# Patient Record
Sex: Female | Born: 1995 | Race: Black or African American | Hispanic: No | Marital: Married | Smoking: Current every day smoker
Health system: Southern US, Community
[De-identification: ages and names within clinical notes are randomized; demographics above are authoritative.]

## PROBLEM LIST (undated history)

## (undated) ENCOUNTER — Inpatient Hospital Stay (HOSPITAL_COMMUNITY): Payer: Self-pay

## (undated) DIAGNOSIS — F419 Anxiety disorder, unspecified: Secondary | ICD-10-CM

## (undated) DIAGNOSIS — D573 Sickle-cell trait: Secondary | ICD-10-CM

## (undated) DIAGNOSIS — F410 Panic disorder [episodic paroxysmal anxiety] without agoraphobia: Secondary | ICD-10-CM

## (undated) HISTORY — DX: Sickle-cell trait: D57.3

## (undated) HISTORY — PX: WISDOM TOOTH EXTRACTION: SHX21

## (undated) HISTORY — PX: OTHER SURGICAL HISTORY: SHX169

---
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
## 2013-03-26 ENCOUNTER — Emergency Department (HOSPITAL_COMMUNITY)
Admission: EM | Admit: 2013-03-26 | Discharge: 2013-03-26 | Disposition: A | Discharge status: Home / Self Care | Payer: Medicaid Other | Attending: Emergency Medicine | Admitting: Emergency Medicine

## 2013-03-26 ENCOUNTER — Encounter (HOSPITAL_COMMUNITY): Payer: Self-pay | Admitting: Emergency Medicine

## 2013-03-26 DIAGNOSIS — T7840XA Allergy, unspecified, initial encounter: Secondary | ICD-10-CM

## 2013-03-26 DIAGNOSIS — Z87891 Personal history of nicotine dependence: Secondary | ICD-10-CM | POA: Insufficient documentation

## 2013-03-26 DIAGNOSIS — R21 Rash and other nonspecific skin eruption: Secondary | ICD-10-CM | POA: Insufficient documentation

## 2013-03-26 DIAGNOSIS — R079 Chest pain, unspecified: Secondary | ICD-10-CM | POA: Insufficient documentation

## 2013-03-26 DIAGNOSIS — T4995XA Adverse effect of unspecified topical agent, initial encounter: Secondary | ICD-10-CM | POA: Insufficient documentation

## 2013-03-26 MED ORDER — PREDNISONE 20 MG PO TABS
60.0000 mg | ORAL_TABLET | Freq: Once | ORAL | Status: AC
  Administered 2013-03-26: 60 mg via ORAL
  Filled 2013-03-26: qty 3

## 2013-03-26 MED ORDER — EPINEPHRINE 0.3 MG/0.3ML IJ SOAJ: 0.3000 mg | INTRAMUSCULAR | Status: DC | PRN

## 2013-03-26 MED ORDER — PREDNISONE 20 MG PO TABS: 60.0000 mg | ORAL_TABLET | Freq: Every day | ORAL | Status: DC

## 2013-03-26 MED ORDER — FAMOTIDINE 20 MG PO TABS
20.0000 mg | ORAL_TABLET | Freq: Once | ORAL | Status: AC
  Administered 2013-03-26: 20 mg via ORAL
  Filled 2013-03-26: qty 1

## 2013-03-26 MED ORDER — DIPHENHYDRAMINE HCL 25 MG PO CAPS
25.0000 mg | ORAL_CAPSULE | Freq: Once | ORAL | Status: AC
  Administered 2013-03-26: 25 mg via ORAL
  Filled 2013-03-26: qty 1

## 2013-03-26 NOTE — Discharge Instructions (Signed)
Allergies °Allergies may happen from anything your body is sensitive to. This may be food, medicines, pollens, chemicals, and nearly anything around you in everyday life that produces allergens. An allergen is anything that causes an allergy producing substance. Heredity is often a factor in causing these problems. This means you may have some of the same allergies as your parents. °Food allergies happen in all age groups. Food allergies are some of the most severe and life threatening. Some common food allergies are cow's milk, seafood, eggs, nuts, wheat, and soybeans. °SYMPTOMS  °· Swelling around the mouth. °· An itchy red rash or hives. °· Vomiting or diarrhea. °· Difficulty breathing. °SEVERE ALLERGIC REACTIONS ARE LIFE-THREATENING. °This reaction is called anaphylaxis. It can cause the mouth and throat to swell and cause difficulty with breathing and swallowing. In severe reactions only a trace amount of food (for example, peanut oil in a salad) may cause death within seconds. °Seasonal allergies occur in all age groups. These are seasonal because they usually occur during the same season every year. They may be a reaction to molds, grass pollens, or tree pollens. Other causes of problems are house dust mite allergens, pet dander, and mold spores. The symptoms often consist of nasal congestion, a runny itchy nose associated with sneezing, and tearing itchy eyes. There is often an associated itching of the mouth and ears. The problems happen when you come in contact with pollens and other allergens. Allergens are the particles in the air that the body reacts to with an allergic reaction. This causes you to release allergic antibodies. Through a chain of events, these eventually cause you to release histamine into the blood stream. Although it is meant to be protective to the body, it is this release that causes your discomfort. This is why you were given anti-histamines to feel better.  If you are unable to  pinpoint the offending allergen, it may be determined by skin or blood testing. Allergies cannot be cured but can be controlled with medicine. °Hay fever is a collection of all or some of the seasonal allergy problems. It may often be treated with simple over-the-counter medicine such as diphenhydramine. Take medicine as directed. Do not drink alcohol or drive while taking this medicine. Check with your caregiver or package insert for child dosages. °If these medicines are not effective, there are many new medicines your caregiver can prescribe. Stronger medicine such as nasal spray, eye drops, and corticosteroids may be used if the first things you try do not work well. Other treatments such as immunotherapy or desensitizing injections can be used if all else fails. Follow up with your caregiver if problems continue. These seasonal allergies are usually not life threatening. They are generally more of a nuisance that can often be handled using medicine. °HOME CARE INSTRUCTIONS  °· If unsure what causes a reaction, keep a diary of foods eaten and symptoms that follow. Avoid foods that cause reactions. °· If hives or rash are present: °· Take medicine as directed. °· You may use an over-the-counter antihistamine (diphenhydramine) for hives and itching as needed. °· Apply cold compresses (cloths) to the skin or take baths in cool water. Avoid hot baths or showers. Heat will make a rash and itching worse. °· If you are severely allergic: °· Following a treatment for a severe reaction, hospitalization is often required for closer follow-up. °· Wear a medic-alert bracelet or necklace stating the allergy. °· You and your family must learn how to give adrenaline or use   an anaphylaxis kit. °· If you have had a severe reaction, always carry your anaphylaxis kit or EpiPen® with you. Use this medicine as directed by your caregiver if a severe reaction is occurring. Failure to do so could have a fatal outcome. °SEEK MEDICAL  CARE IF: °· You suspect a food allergy. Symptoms generally happen within 30 minutes of eating a food. °· Your symptoms have not gone away within 2 days or are getting worse. °· You develop new symptoms. °· You want to retest yourself or your child with a food or drink you think causes an allergic reaction. Never do this if an anaphylactic reaction to that food or drink has happened before. Only do this under the care of a caregiver. °SEEK IMMEDIATE MEDICAL CARE IF:  °· You have difficulty breathing, are wheezing, or have a tight feeling in your chest or throat. °· You have a swollen mouth, or you have hives, swelling, or itching all over your body. °· You have had a severe reaction that has responded to your anaphylaxis kit or an EpiPen®. These reactions may return when the medicine has worn off. These reactions should be considered life threatening. °MAKE SURE YOU:  °· Understand these instructions. °· Will watch your condition. °· Will get help right away if you are not doing well or get worse. °Document Released: 05/12/2002 Document Revised: 06/13/2012 Document Reviewed: 10/17/2007 °[employer]® Patient Information ©2014 [employer]. ° °

## 2013-03-26 NOTE — ED Notes (Signed)
Pt reports having a rash to all 4 extremities since going to bed tonight, [facility] rash recently and was given Prednisone. Stated this helped but now the rash has returned worse than before with centralized chest tightness. Pt states that red raised rash itches but does not hurt. Pt a&o x 4, NAD noted at this time

## 2013-03-26 NOTE — ED Provider Notes (Signed)
CSN: [id number]     Arrival date & time 03/26/13  0344 History   First MD Initiated Contact with Patient 03/26/13 0501     Chief Complaint  Patient presents with  . Rash   (Consider location/radiation/quality/duration/timing/severity/associated sxs/prior Treatment) HPI History provided by patient. Itchy rash onset a few hours ago. At that time had some chest tightness but this has now resolved. Patient denies any known allergens including new foods, detergents, soaps. She denies taking any medications. States she had an allergic reaction like this in the past and was prescribed prednisone which helped her symptoms. She denies any tongue or lip swelling. No difficulty breathing. No fevers or chills. No known sick contacts. Symptoms moderate in severity. Past Medical History  Diagnosis Date  . Panic attack   . Anxiety    Past Surgical History  Procedure Laterality Date  . None    . Wisdom tooth extraction     Family History  Problem Relation Age of Onset  . Diabetes Mother   . Hypertension Mother   . Diabetes Father   . Hypertension Father    History  Substance Use Topics  . Smoking status: Former Smoker    Quit date: 08/01/2011  . Smokeless tobacco: Never Used  . Alcohol Use: No   OB History   Grav Para Term Preterm Abortions TAB SAB Ect Mult Living   1 1 0 1 0 0 0 0 0 1      Review of Systems  Constitutional: Negative for fever and chills.  Respiratory: Positive for chest tightness. Negative for shortness of breath.   Cardiovascular: Negative for chest pain.  Gastrointestinal: Negative for vomiting and abdominal pain.  Genitourinary: Negative for dysuria.  Musculoskeletal: Negative for back pain.  Skin: Positive for rash.  Neurological: Negative for headaches.  All other systems reviewed and are negative.    Allergies  Review of patient's allergies indicates no known allergies.  Home Medications  No current outpatient prescriptions on file. BP 117/65  Pulse 84   Temp(Src) 97.8 F (36.6 C) (Oral)  Resp 16  Ht 5\' 3"  (1.6 m)  Wt 222 lb (100.699 kg)  BMI 39.34 kg/m2  SpO2 98%  LMP 03/15/2013 Physical Exam  Constitutional: She is oriented to person, place, and time. She appears well-developed and well-nourished.  HENT:  Head: Normocephalic and atraumatic.  Eyes: EOM are normal. Pupils are equal, round, and reactive to light.  Neck: Neck supple.  Cardiovascular: Normal rate, regular rhythm and intact distal pulses.   Pulmonary/Chest: Effort normal and breath sounds normal. No stridor. No respiratory distress. She has no wheezes. She exhibits no tenderness.  Abdominal: Soft. She exhibits no distension.  Musculoskeletal: Normal range of motion. She exhibits no edema.  Neurological: She is alert and oriented to person, place, and time.  Skin: Skin is warm and dry.  A few patches of raised erythematous with blanching rash his torso and extremities. No oral, plantar or palmar involvement    ED Course  Procedures (including critical care time) Labs Review Labs Reviewed - No data to display Imaging Review No results found.  Prednisone, Benadryl, Pepcid provided  Plan discharge home with prescription for prednisone and instructions to take Benadryl as needed for return of itching. Patient also prescribed an EpiPen with instructions. She agrees to followup as an outpatient for allergy testing. Patient encouraged to keep a food journal. Stable for discharge home.  MDM  Diagnosis: Allergic reaction  No anaphylaxis or respiratory compromise Medications provided Vital signs and  nursing notes reviewed and considered   Sunnie Nielsen, MD 03/26/13 [phone]

## 2013-05-15 ENCOUNTER — Emergency Department (HOSPITAL_COMMUNITY)
Admission: EM | Admit: 2013-05-15 | Discharge: 2013-05-16 | Disposition: A | Discharge status: Home / Self Care | Payer: Medicaid Other | Attending: Emergency Medicine | Admitting: Emergency Medicine

## 2013-05-15 ENCOUNTER — Encounter (HOSPITAL_COMMUNITY): Payer: Self-pay | Admitting: Emergency Medicine

## 2013-05-15 ENCOUNTER — Emergency Department (HOSPITAL_COMMUNITY): Payer: Medicaid Other

## 2013-05-15 DIAGNOSIS — R079 Chest pain, unspecified: Secondary | ICD-10-CM | POA: Insufficient documentation

## 2013-05-15 DIAGNOSIS — F17200 Nicotine dependence, unspecified, uncomplicated: Secondary | ICD-10-CM | POA: Insufficient documentation

## 2013-05-15 DIAGNOSIS — Z79899 Other long term (current) drug therapy: Secondary | ICD-10-CM | POA: Insufficient documentation

## 2013-05-15 DIAGNOSIS — L509 Urticaria, unspecified: Secondary | ICD-10-CM | POA: Insufficient documentation

## 2013-05-15 DIAGNOSIS — I10 Essential (primary) hypertension: Secondary | ICD-10-CM | POA: Insufficient documentation

## 2013-05-15 DIAGNOSIS — T7840XA Allergy, unspecified, initial encounter: Secondary | ICD-10-CM

## 2013-05-15 DIAGNOSIS — IMO0002 Reserved for concepts with insufficient information to code with codable children: Secondary | ICD-10-CM | POA: Insufficient documentation

## 2013-05-15 DIAGNOSIS — F410 Panic disorder [episodic paroxysmal anxiety] without agoraphobia: Secondary | ICD-10-CM | POA: Insufficient documentation

## 2013-05-15 DIAGNOSIS — R110 Nausea: Secondary | ICD-10-CM | POA: Insufficient documentation

## 2013-05-15 DIAGNOSIS — R0602 Shortness of breath: Secondary | ICD-10-CM | POA: Insufficient documentation

## 2013-05-15 IMAGING — CR DG CHEST 2V
2 series · 2 of 2 positions shown · non-contrast
Comparison: PA and lateral chest [DATE].

CLINICAL DATA: Chest tightness.

EXAM:
CHEST  2 VIEW

[w chest pa]
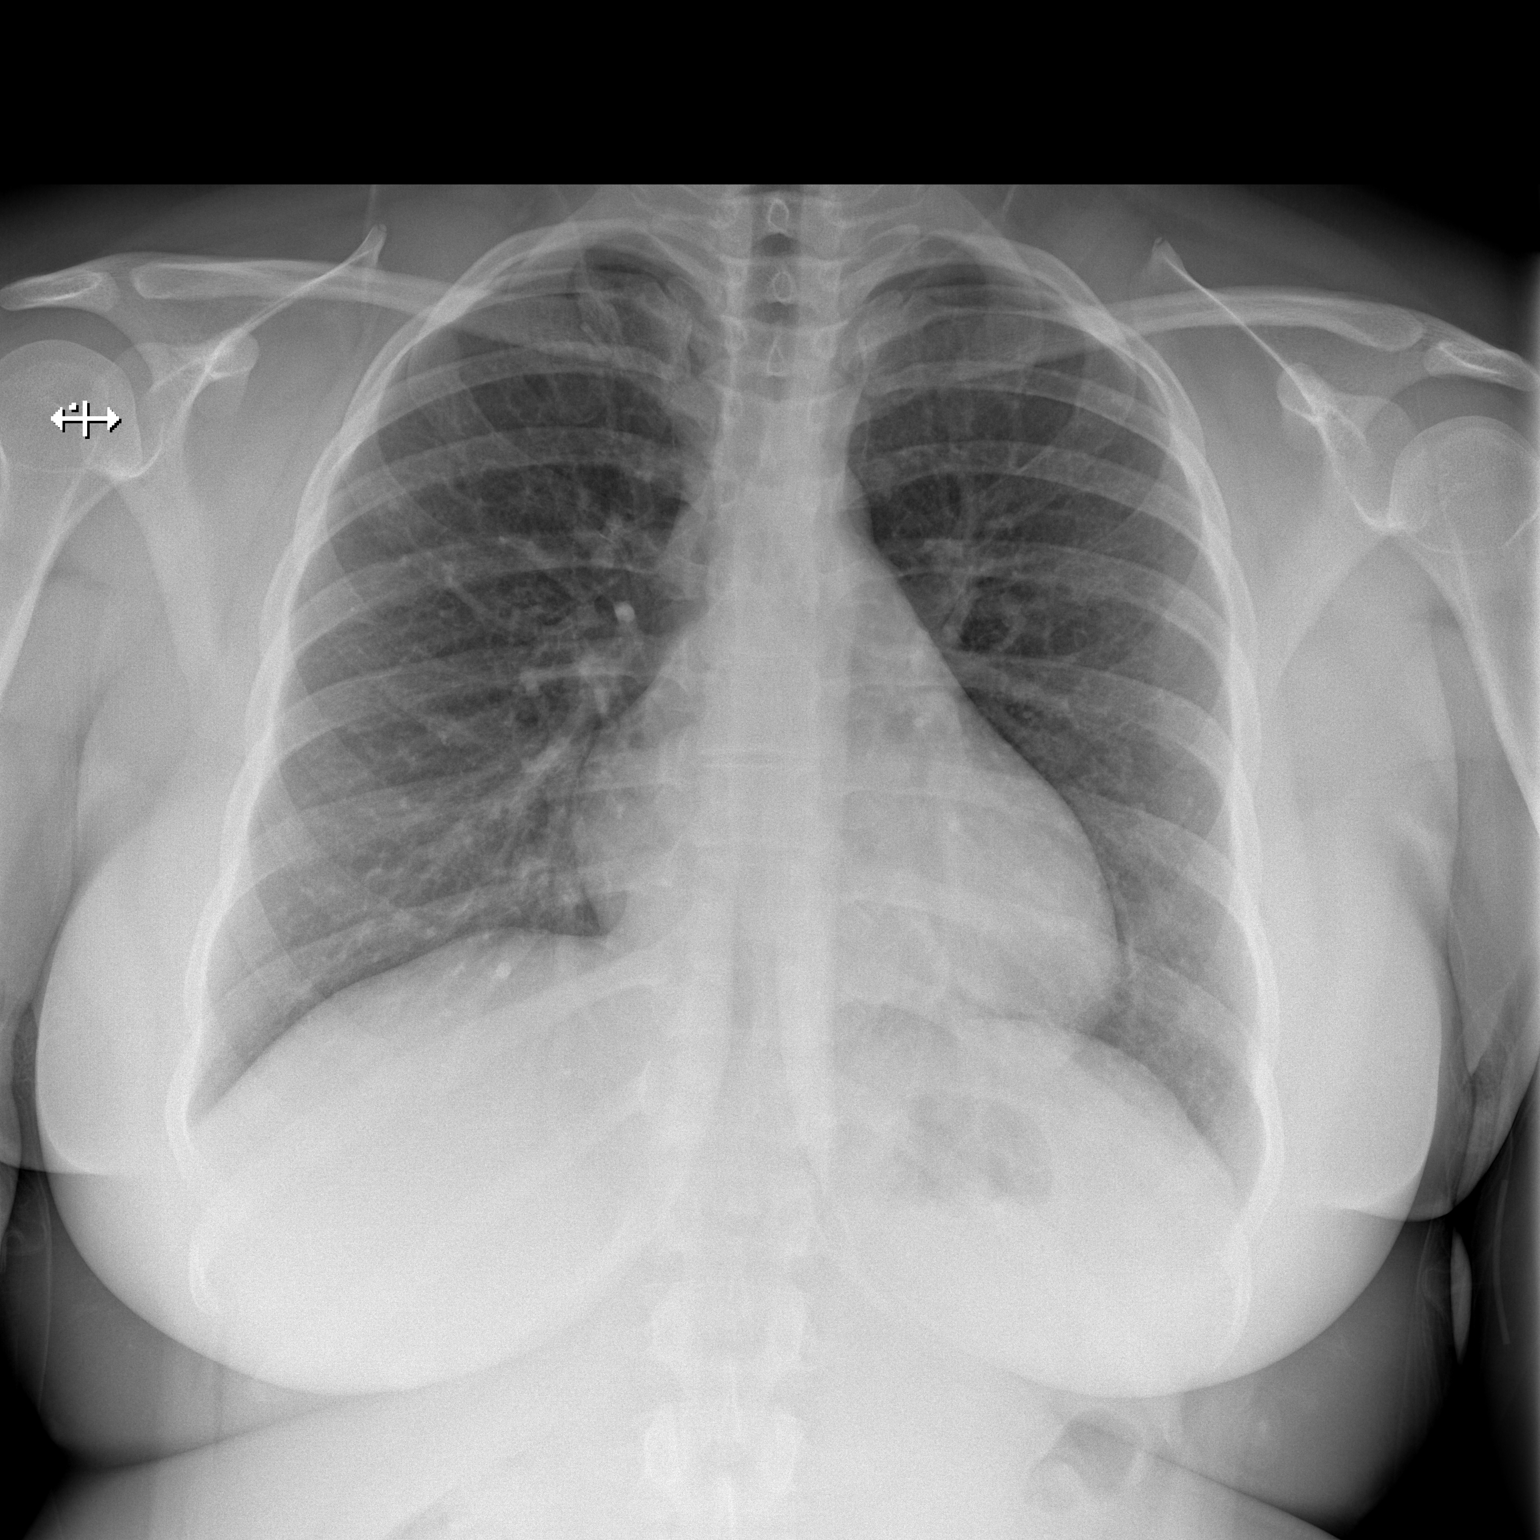

[w chest lat]
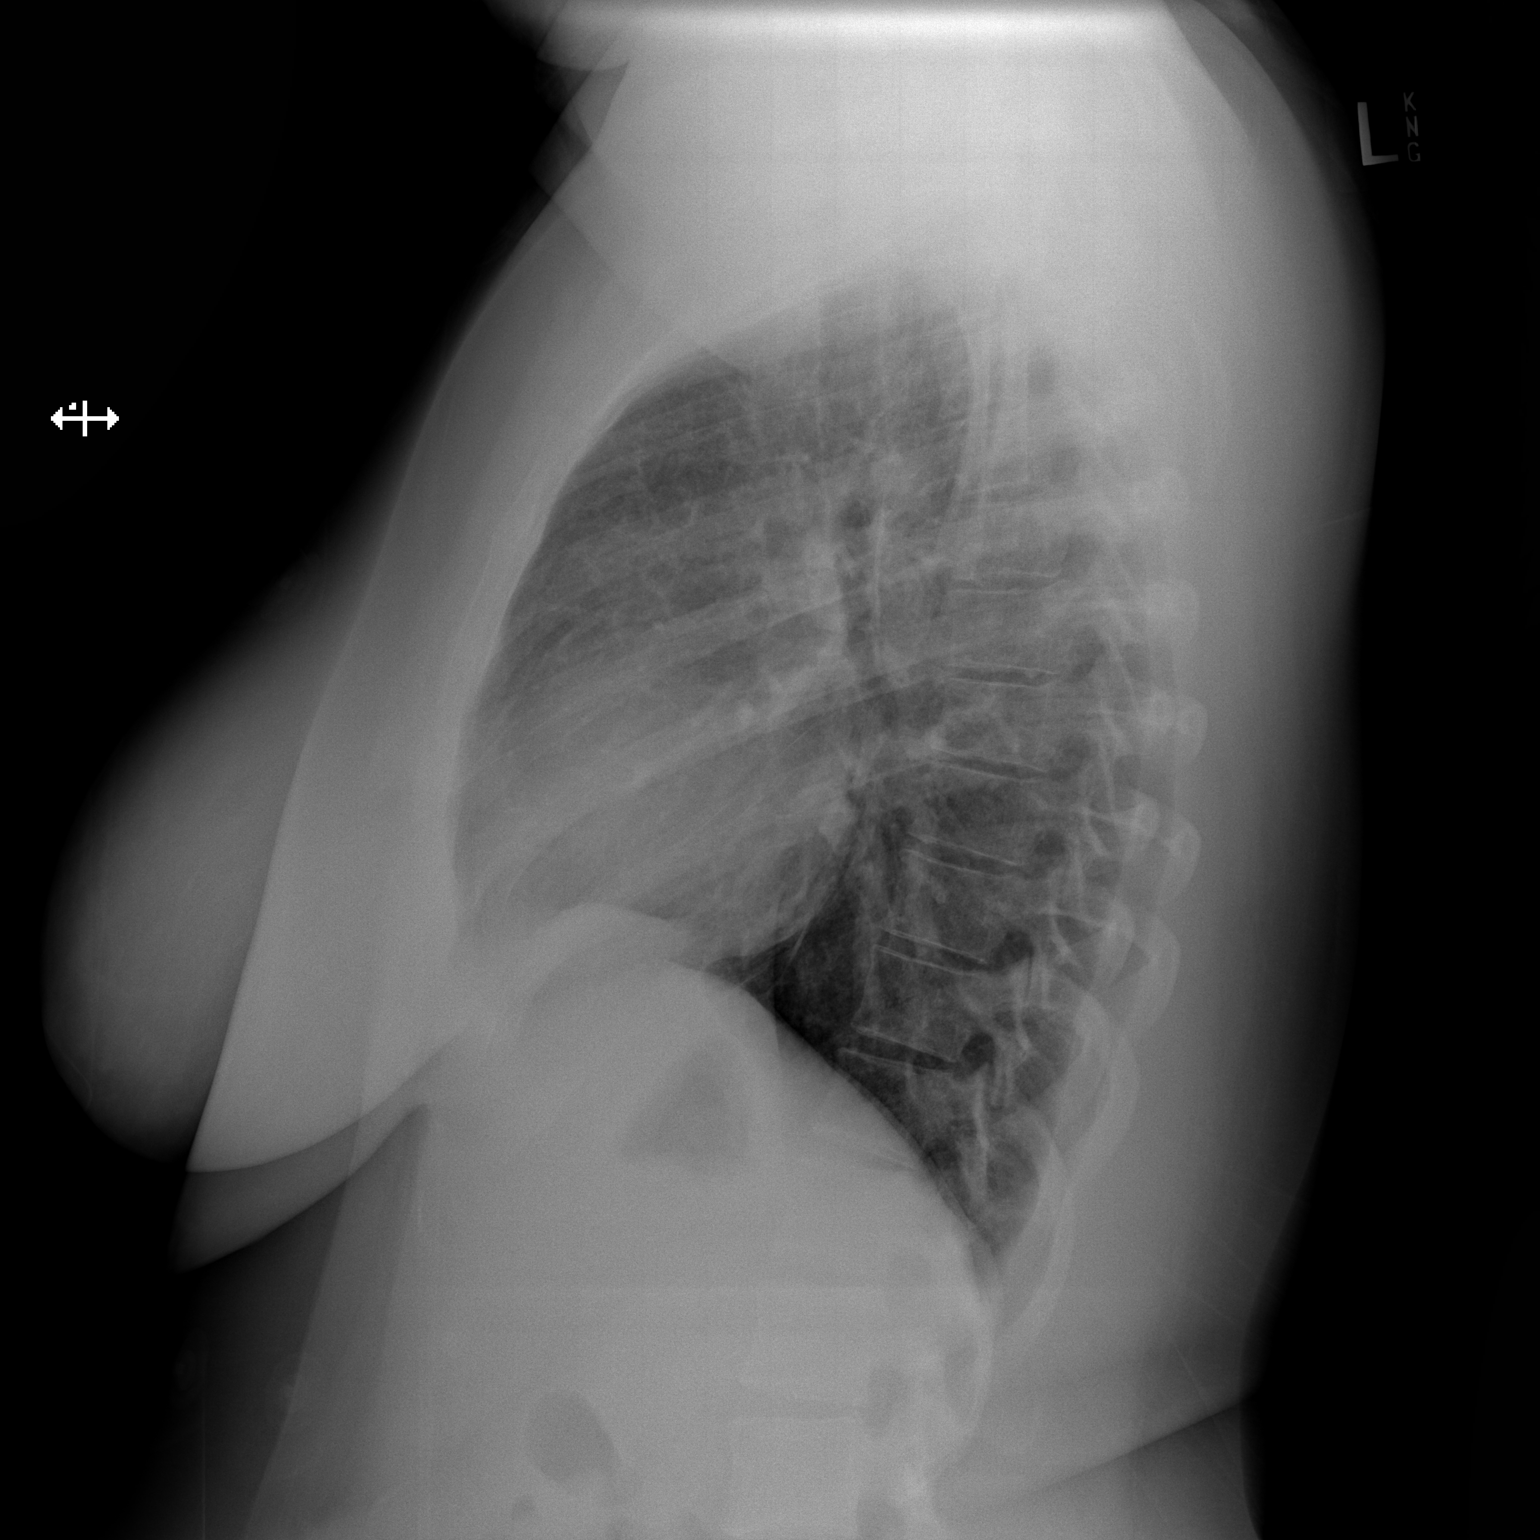

[2 of 2 positions shown; findings below may reference images not displayed]

FINDINGS: Lungs clear. Heart size normal. No pneumothorax or pleural effusion.
No focal bony abnormality.
IMPRESSION: Negative chest.

## 2013-05-15 MED ORDER — HYDROCORTISONE 1 % EX LOTN
TOPICAL_LOTION | Freq: Two times a day (BID) | CUTANEOUS | Status: DC
  Administered 2013-05-16: via TOPICAL
  Filled 2013-05-15: qty 118

## 2013-05-15 MED ORDER — METHYLPREDNISOLONE SODIUM SUCC 125 MG IJ SOLR
125.0000 mg | Freq: Once | INTRAMUSCULAR | Status: AC
  Administered 2013-05-16: 125 mg via INTRAMUSCULAR
  Filled 2013-05-15: qty 2

## 2013-05-15 MED ORDER — LORATADINE 10 MG PO TABS
10.0000 mg | ORAL_TABLET | Freq: Once | ORAL | Status: AC
  Administered 2013-05-16: 10 mg via ORAL
  Filled 2013-05-15: qty 1

## 2013-05-15 NOTE — ED Provider Notes (Signed)
CSN: [id number]     Arrival date & time 05/15/13  2014 History  This chart was scribed for non-physician practitioner Jeannetta EllisJennifer L Zaydin Billey, PA-C working with Gavin PoundMichael Y. Oletta LamasGhim, MD by Donne Anonayla Curran, ED Scribe. This patient was seen in room WTR7/WTR7 and the patient's care was started at 2316.    First MD Initiated Contact with Patient 05/15/13 2316     Chief Complaint  Patient presents with  . Urticaria    The history is provided by the patient. No language interpreter was used.   HPI Comments: Judith Lee is a 18 y.o. female who presents to the Emergency Department complaining of several weeks of ongoing hives to her arms, chest, and thighs. She denies genital involvement. She complains of an acute onset of mid sternal CP that began at 5 pm today while she was at work and has begun to resolve. Her hives worsened this evening during this chest tightness episode. She states at first she had SOB and nausea but that resolved. She has tried Benadryl with little relief. Her last dose was at 4 pm today. She had a similar episode a few months ago and was told she was having an allergic reaction. She has an appointment with an allergist on 05/31/13. She denies vomiting, congestion, rhinorrhea, or any other symptoms.    Past Medical History  Diagnosis Date  . Panic attack   . Anxiety    Past Surgical History  Procedure Laterality Date  . None    . Wisdom tooth extraction     Family History  Problem Relation Age of Onset  . Diabetes Mother   . Hypertension Mother   . Diabetes Father   . Hypertension Father    History  Substance Use Topics  . Smoking status: Current Some Day Smoker  . Smokeless tobacco: Never Used  . Alcohol Use: No   OB History   Grav Para Term Preterm Abortions TAB SAB Ect Mult Living   1 1 0 1 0 0 0 0 0 1      Review of Systems  HENT: Negative for congestion, rhinorrhea and trouble swallowing.   Respiratory: Positive for shortness of breath. Negative for cough.    Cardiovascular: Positive for chest pain.  Gastrointestinal: Positive for nausea. Negative for vomiting.  Skin: Positive for rash.  All other systems reviewed and are negative.      Allergies  Review of patient's allergies indicates no known allergies.  Home Medications   Current Outpatient Rx  Name  Route  Sig  Dispense  Refill  . Multiple Vitamin (MULTIVITAMIN WITH MINERALS) TABS tablet   Oral   Take 1 tablet by mouth daily.         Marland Kitchen. EPINEPHrine (EPIPEN) 0.3 mg/0.3 mL SOAJ injection   Intramuscular   Inject 0.3 mLs (0.3 mg total) into the muscle as needed.   1 Device   1   . loratadine (CLARITIN) 10 MG tablet   Oral   Take 1 tablet (10 mg total) by mouth daily.   30 tablet   0   . predniSONE (DELTASONE) 20 MG tablet   Oral   Take 2 tablets (40 mg total) by mouth daily.   10 tablet   0    BP 141/76  Pulse 75  Temp(Src) 98.4 F (36.9 C) (Oral)  Resp 18  Ht 5\' 3"  (1.6 m)  Wt 226 lb (102.513 kg)  BMI 40.04 kg/m2  SpO2 100%  LMP 05/03/2013  Physical Exam  Constitutional: She is  oriented to person, place, and time. She appears well-developed and well-nourished. No distress.  HENT:  Head: Normocephalic and atraumatic.  Right Ear: External ear normal.  Left Ear: External ear normal.  Nose: Nose normal.  Mouth/Throat: Uvula is midline, oropharynx is clear and moist and mucous membranes are normal. No trismus in the jaw. No uvula swelling. No oropharyngeal exudate, posterior oropharyngeal edema or posterior oropharyngeal erythema.  Eyes: Conjunctivae are normal. Pupils are equal, round, and reactive to light.  Neck: Normal range of motion. Neck supple.  Cardiovascular: Normal rate, regular rhythm and normal heart sounds.   Pulmonary/Chest: Effort normal and breath sounds normal. She exhibits no tenderness.  Abdominal: Soft.  Musculoskeletal: Normal range of motion.  Neurological: She is alert and oriented to person, place, and time.  Skin: Skin is warm and  dry. Rash noted. Rash is urticarial. She is not diaphoretic.  To bilateral upper extremities, chest and thighs  No other involvement.   Psychiatric: She has a normal mood and affect.    ED Course  Procedures (including critical care time) Medications  methylPREDNISolone sodium succinate (SOLU-MEDROL) 125 mg/2 mL injection 125 mg (125 mg Intramuscular Given 05/16/13 0000)  loratadine (CLARITIN) tablet 10 mg (10 mg Oral Given 05/16/13 0000)    DIAGNOSTIC STUDIES: Oxygen Saturation is 100% on RA, normal by my interpretation.    COORDINATION OF CARE: 11:39 PM Discussed treatment plan which includes solu-medrol injection, Claritin, hydrocortisone cream, CXR and EKG with pt at bedside and pt agreed to plan.    Labs Review Labs Reviewed - No data to display Imaging Review Dg Chest 2 View  05/16/2013   CLINICAL DATA:  Chest tightness.  EXAM: CHEST  2 VIEW  COMPARISON:  PA and lateral chest 12/09/2011.  FINDINGS: Lungs clear. Heart size normal. No pneumothorax or pleural effusion. No focal bony abnormality.  IMPRESSION: Negative chest.   Electronically Signed   By: Drusilla Kanner M.D.   On: 05/16/2013 00:23     EKG Interpretation None      MDM   Final diagnoses:  Allergic reaction    Filed Vitals:   05/15/13 2052  BP: 141/76  Pulse: 75  Temp: 98.4 F (36.9 C)  Resp: 18    Afebrile, NAD, non-toxic appearing, AAOx4.  Patient re-evaluated prior to dc, is hemodynamically stable, in no respiratory distress, and denies the feeling of throat closing. Pt has been advised to take OTC benadryl & return to the ED if they have a mod-severe allergic rxn (s/s including throat closing, difficulty breathing, swelling of lips face or tongue). Pt is to follow up with their PCP. Pt is agreeable with plan & verbalizes understanding.   I personally performed the services described in this documentation, which was scribed in my presence. The recorded information has been reviewed and is  accurate.    Jeannetta Ellis, PA-C 05/16/13 [phone]

## 2013-05-15 NOTE — ED Notes (Addendum)
Pt reports chest pain and hives. Chest pain is located to the R chest that gets tight. Pt denies any vomiting, back pain, or arm pain. Pt states that hives has been going on since last visit. Pt alert and ambulatory. Hives located to upper arms and inner thigh that pt reports itching and burning.

## 2013-05-16 MED ORDER — LORATADINE 10 MG PO TABS: 10.0000 mg | ORAL_TABLET | Freq: Every day | ORAL | Status: DC

## 2013-05-16 MED ORDER — PREDNISONE 20 MG PO TABS: 40.0000 mg | ORAL_TABLET | Freq: Every day | ORAL | Status: DC

## 2013-05-16 NOTE — Discharge Instructions (Signed)
Please follow up with your primary care physician in 1-2 days. If you do not have one please call the [facility] number listed above. Please keep your allergist appointment. Please take Prednisone as prescribed. Please read all discharge instructions and return precautions.    Allergies Allergies may happen from anything your body is sensitive to. This may be food, medicines, pollens, chemicals, and nearly anything around you in everyday life that produces allergens. An allergen is anything that causes an allergy producing substance. Heredity is often a factor in causing these problems. This means you may have some of the same allergies as your parents. Food allergies happen in all age groups. Food allergies are some of the most severe and life threatening. Some common food allergies are cow's milk, seafood, eggs, nuts, wheat, and soybeans. SYMPTOMS   Swelling around the mouth.  An itchy red rash or hives.  Vomiting or diarrhea.  Difficulty breathing. SEVERE ALLERGIC REACTIONS ARE LIFE-THREATENING. This reaction is called anaphylaxis. It can cause the mouth and throat to swell and cause difficulty with breathing and swallowing. In severe reactions only a trace amount of food (for example, peanut oil in a salad) may cause death within seconds. Seasonal allergies occur in all age groups. These are seasonal because they usually occur during the same season every year. They may be a reaction to molds, grass pollens, or tree pollens. Other causes of problems are house dust mite allergens, pet dander, and mold spores. The symptoms often consist of nasal congestion, a runny itchy nose associated with sneezing, and tearing itchy eyes. There is often an associated itching of the mouth and ears. The problems happen when you come in contact with pollens and other allergens. Allergens are the particles in the air that the body reacts to with an allergic reaction. This causes you to release  allergic antibodies. Through a chain of events, these eventually cause you to release histamine into the blood stream. Although it is meant to be protective to the body, it is this release that causes your discomfort. This is why you were given anti-histamines to feel better. If you are unable to pinpoint the offending allergen, it may be determined by skin or blood testing. Allergies cannot be cured but can be controlled with medicine. Hay fever is a collection of all or some of the seasonal allergy problems. It may often be treated with simple over-the-counter medicine such as diphenhydramine. Take medicine as directed. Do not drink alcohol or drive while taking this medicine. Check with your caregiver or package insert for child dosages. If these medicines are not effective, there are many new medicines your caregiver can prescribe. Stronger medicine such as nasal spray, eye drops, and corticosteroids may be used if the first things you try do not work well. Other treatments such as immunotherapy or desensitizing injections can be used if all else fails. Follow up with your caregiver if problems continue. These seasonal allergies are usually not life threatening. They are generally more of a nuisance that can often be handled using medicine. HOME CARE INSTRUCTIONS   If unsure what causes a reaction, keep a diary of foods eaten and symptoms that follow. Avoid foods that cause reactions.  If hives or rash are present:  Take medicine as directed.  You may use an over-the-counter antihistamine (diphenhydramine) for hives and itching as needed.  Apply cold compresses (cloths) to the skin or take baths in cool water. Avoid hot baths or showers. Heat will make  a rash and itching worse.  If you are severely allergic:  Following a treatment for a severe reaction, hospitalization is often required for closer follow-up.  Wear a medic-alert bracelet or necklace stating the allergy.  You and your family  must learn how to give adrenaline or use an anaphylaxis kit.  If you have had a severe reaction, always carry your anaphylaxis kit or EpiPen with you. Use this medicine as directed by your caregiver if a severe reaction is occurring. Failure to do so could have a fatal outcome. SEEK MEDICAL CARE IF:  You suspect a food allergy. Symptoms generally happen within 30 minutes of eating a food.  Your symptoms have not gone away within 2 days or are getting worse.  You develop new symptoms.  You want to retest yourself or your child with a food or drink you think causes an allergic reaction. Never do this if an anaphylactic reaction to that food or drink has happened before. Only do this under the care of a caregiver. SEEK IMMEDIATE MEDICAL CARE IF:   You have difficulty breathing, are wheezing, or have a tight feeling in your chest or throat.  You have a swollen mouth, or you have hives, swelling, or itching all over your body.  You have had a severe reaction that has responded to your anaphylaxis kit or an EpiPen. These reactions may return when the medicine has worn off. These reactions should be considered life threatening. MAKE SURE YOU:   Understand these instructions.  Will watch your condition.  Will get help right away if you are not doing well or get worse. Document Released: 05/12/2002 Document Revised: 06/13/2012 Document Reviewed: 10/17/2007 [facility] Patient Information 2014 [place].

## 2013-05-19 NOTE — ED Provider Notes (Signed)
Medical screening examination/treatment/procedure(s) were performed by non-physician practitioner and as supervising physician I was immediately available for consultation/collaboration.   Kalenna Millett M Jerrianne Hartin, MD 05/19/13 1018 

## 2013-06-19 ENCOUNTER — Encounter (HOSPITAL_COMMUNITY): Payer: Self-pay | Admitting: Emergency Medicine

## 2013-06-19 ENCOUNTER — Emergency Department (HOSPITAL_COMMUNITY)
Admission: EM | Admit: 2013-06-19 | Discharge: 2013-06-19 | Disposition: A | Discharge status: Home / Self Care | Payer: Medicaid Other | Attending: Emergency Medicine | Admitting: Emergency Medicine

## 2013-06-19 DIAGNOSIS — L509 Urticaria, unspecified: Secondary | ICD-10-CM

## 2013-06-19 DIAGNOSIS — F17200 Nicotine dependence, unspecified, uncomplicated: Secondary | ICD-10-CM | POA: Insufficient documentation

## 2013-06-19 DIAGNOSIS — Z79899 Other long term (current) drug therapy: Secondary | ICD-10-CM | POA: Insufficient documentation

## 2013-06-19 DIAGNOSIS — Z8659 Personal history of other mental and behavioral disorders: Secondary | ICD-10-CM | POA: Insufficient documentation

## 2013-06-19 MED ORDER — DIPHENHYDRAMINE HCL 25 MG PO CAPS: 25.0000 mg | ORAL_CAPSULE | Freq: Once | ORAL | Status: DC

## 2013-06-19 MED ORDER — PREDNISONE 20 MG PO TABS: ORAL_TABLET | ORAL | Status: DC

## 2013-06-19 MED ORDER — PREDNISONE 20 MG PO TABS
60.0000 mg | ORAL_TABLET | Freq: Once | ORAL | Status: AC
  Administered 2013-06-19: 60 mg via ORAL
  Filled 2013-06-19: qty 3

## 2013-06-19 NOTE — ED Notes (Signed)
Initial Contact - pt with raised, red hives to arms, legs and chest.  Pt reports hx allergic reactions, unable to f/u for allergy testing because of insurance issues.  Pt denies SOB, difficult breathing.  Reports hives x1 week, "getting worse".  NAD.

## 2013-06-19 NOTE — ED Provider Notes (Signed)
CSN: [id number]     Arrival date & time 06/19/13  1418 History  This chart was scribed for non-physician practitioner, Raymon MuttonMarissa Arlee Santosuosso, PA-C,working with Raeford RazorStephen Kohut, MD by Karle PlumberJennifer Tensley, ED Scribe.  This patient was seen in room WTR7/WTR7 and the patient's care was started at 3:42 PM.  Chief Complaint  Patient presents with  . Urticaria   The history is provided by the patient. No language interpreter was used.   HPI Comments:  Judith Lee is a 18 y.o. female with h/o hives who presents to the Emergency Department complaining of itching and burning hives that started 1.5 weeks ago. Pt states she intermittently gets the hives for the last 5-6 months. She states she was referred to get allergy testing but has not been able to secondary to insurance issues. She states she has been here two times previously and was treated with Claritin and steroids with intermittent relief. She denies SOB, difficulty breathing, CP, throat closing sensation, numbness or tingling, nausea, vomiting, diarrhea, or fever, sore throat or difficulty swallowing. She denies any new or changing lotions, soaps, creams, detergents, clothing or anything else.    Past Medical History  Diagnosis Date  . Panic attack   . Anxiety    Past Surgical History  Procedure Laterality Date  . None    . Wisdom tooth extraction     Family History  Problem Relation Age of Onset  . Diabetes Mother   . Hypertension Mother   . Diabetes Father   . Hypertension Father    History  Substance Use Topics  . Smoking status: Current Some Day Smoker  . Smokeless tobacco: Never Used  . Alcohol Use: No   OB History   Grav Para Term Preterm Abortions TAB SAB Ect Mult Living   1 1 0 1 0 0 0 0 0 1      Review of Systems  Constitutional: Negative for fever.  HENT: Negative for sore throat and trouble swallowing.   Respiratory: Negative for shortness of breath and wheezing.   Cardiovascular: Negative for chest pain.   Gastrointestinal: Negative for nausea, vomiting and diarrhea.  Skin: Positive for rash (bilateral arms).  Neurological: Negative for numbness.  All other systems reviewed and are negative.   Allergies  Review of patient's allergies indicates no known allergies.  Home Medications   Prior to Admission medications   Medication Sig Start Date End Date Taking? Authorizing Provider  loratadine (CLARITIN) 10 MG tablet Take 1 tablet (10 mg total) by mouth daily. 05/16/13  Yes Jennifer L Piepenbrink, PA-C  Multiple Vitamin (MULTIVITAMIN WITH MINERALS) TABS tablet Take 1 tablet by mouth daily.   Yes Historical Provider, MD  EPINEPHrine (EPIPEN) 0.3 mg/0.3 mL SOAJ injection Inject 0.3 mLs (0.3 mg total) into the muscle as needed. 03/26/13   Sunnie NielsenBrian Opitz, MD   Triage Vitals: BP 138/73  Pulse 80  Temp(Src) 98.2 F (36.8 C) (Oral)  Resp 16  SpO2 98%  LMP 06/16/2013 Physical Exam  Nursing note and vitals reviewed. Constitutional: She is oriented to person, place, and time. She appears well-developed and well-nourished.  HENT:  Head: Normocephalic and atraumatic.  Mouth/Throat: Oropharynx is clear and moist. No oropharyngeal exudate.  Negative mucosal findings Negative tongue swelling Negative angioedema  Eyes: Conjunctivae and EOM are normal. Pupils are equal, round, and reactive to light. Right eye exhibits no discharge. Left eye exhibits no discharge.  Neck: Normal range of motion. Neck supple. No tracheal deviation present.  Cardiovascular: Normal rate, regular rhythm and normal  heart sounds.  Exam reveals no friction rub.   No murmur heard. Pulses:      Radial pulses are 2+ on the right side, and 2+ on the left side.  Cap refill less than 3 seconds  Pulmonary/Chest: Effort normal and breath sounds normal. No respiratory distress. She has no wheezes. She has no rales.  Patient is able to speak in full sentences without difficulty Negative use of accessory muscles Negative stridor   Musculoskeletal: Normal range of motion.  Full ROM to upper and lower extremities without difficulty noted, negative ataxia noted.  Lymphadenopathy:    She has no cervical adenopathy.  Neurological: She is alert and oriented to person, place, and time. No cranial nerve deficit. She exhibits normal muscle tone. Coordination normal.  Cranial nerves III-XII grossly intact Strength 5+/5+ to upper and lower extremities bilaterally with resistance applied, equal distribution noted  Skin: Skin is warm and dry. Rash noted. No erythema.     Hives identified to the breasts bilaterally, arms bilaterally, upper thighs bilaterally with negative blanching.  Psychiatric: She has a normal mood and affect. Her behavior is normal.    ED Course  Procedures (including critical care time) DIAGNOSTIC STUDIES: Oxygen Saturation is 98% on RA, normal by my interpretation.   COORDINATION OF CARE: 3:48 PM- Will speak with Dr. Juleen ChinaKohut for appropriate course of treatment. Pt verbalizes understanding and agrees to plan.  Medications  predniSONE (DELTASONE) tablet 60 mg (60 mg Oral Given 06/19/13 1617)    Labs Review Labs Reviewed - No data to display  Imaging Review No results found.   EKG Interpretation None      MDM   Final diagnoses:  Urticarial rash    Medications  predniSONE (DELTASONE) tablet 60 mg (60 mg Oral Given 06/19/13 1617)   Filed Vitals:   06/19/13 1436  BP: 138/73  Pulse: 80  Temp: 98.2 F (36.8 C)  TempSrc: Oral  Resp: 16  SpO2: 98%    I personally performed the services described in this documentation, which was scribed in my presence. The recorded information has been reviewed and is accurate.  Patient presenting to the ED with high as it is been ongoing for approximately week and half. Stated that she's been seen twice within the ED setting regarding this issue. Patient reports she's been taking Claritin, prednisone, Benadryl minimal relief. Stated that her insurance  does not kick in until 06/30/2013-reports she is going to get an appointment with an allergist. Stated that the hives are localized her breasts, arms, legs. Stated that she is been using medications as prescribed but with minimal relief. Denied chest tightness, difficulty breathing, throat closing sensation, numbness, tingling, swelling to the tongue, angioedema. Alert and oriented. GCS 15. Heart rate and rhythm normal. Lungs good auscultation to upper and lower lobes bilaterally. Radial 2+ bilaterally. Negative angioedema. Negative tongue swelling. Patient stable to speak in full sentences without difficulty. Negative stridor. Negative use of accessory muscles. Hives identified to the breasts bilaterally, arms bilaterally, upper thighs bilaterally with negative blanching. Discussed case with attending physician who recommended patient to be discharged with prednisone. Discussed that this will be the patient's third visit to the ED regarding urticarial rash. Recommended patient to followup as outpatient with allergist. Negative findings of anaphylaxis. Negative angioedema. Patient stable, afebrile. Negative signs of respiratory distress. Negative stridor. Negative tongue swelling. Discharged patient. Discharged patient with prednisone. [facility] and list of allergist given. Discussed with patient to rest and stay hydrated.  Discussed with patient to closely monitor symptoms and if symptoms are to worsen or change to report back to the ED - strict return instructions given.  Patient agreed to plan of care, understood, all questions answered.   Raymon Mutton, PA-C 06/19/13 2210  Raymon Mutton, PA-C 06/19/13 2210

## 2013-06-19 NOTE — Discharge Instructions (Signed)
Please call your doctor for a followup appointment within 24-48 hours. When you talk to your doctor please let them know that you were seen in the emergency department and have them acquire all of your records so that they can discuss the findings with you and formulate a treatment plan to fully care for your new and ongoing problems. Please call and set-up an appointment with health and wellness center Please rest and stay hydrated Please take prednisone as prescribed Please continue to monitor symptoms closely and if symptoms are to worsen or change (fever greater than 101, chills, sweating, nausea, vomiting, chest tightness, chest pain, shortness of breath, difficulty breathing, throat closing sensation, tongue swelling, changes to 4 lesions in the mouth, sores in the mouth, difficulty breathing) please report back to the ED immediately   Allergies  Allergies may happen from anything your body is sensitive to. This may be food, medicines, pollens, chemicals, and many other things. Food allergies can be severe and deadly.  HOME CARE  If you do not know what causes a reaction, keep a diary. Write down the foods you ate and the symptoms that followed. Avoid foods that cause reactions.  If you have red raised spots (hives) or a rash:  Take medicine as told by your doctor.  Use medicines for red raised spots and itching as needed.  Apply cold cloths (compresses) to the skin. Take a cool bath. Avoid hot baths or showers.  If you are severely allergic:  It is often necessary to go to the hospital after you have treated your reaction.  Wear your medical alert jewelry.  You and your family must learn how to give a allergy shot or use an allergy kit (anaphylaxis kit).  Always carry your allergy kit or shot with you. Use this medicine as told by your doctor if a severe reaction is occurring. GET HELP RIGHT AWAY IF:  You have trouble breathing or are making high-pitched whistling sounds  (wheezing).  You have a tight feeling in your chest or throat.  You have a puffy (swollen) mouth.  You have red raised spots, puffiness (swelling), or itching all over your body.  You have had a severe reaction that was helped by your allergy kit or shot. The reaction can return once the medicine has worn off.  You think you are having a food allergy. Symptoms most often happen within 30 minutes of eating a food.  Your symptoms have not gone away within 2 days or are getting worse.  You have new symptoms.  You want to retest yourself with a food or drink you think causes an allergic reaction. Only do this under the care of a doctor. MAKE SURE YOU:   Understand these instructions.  Will watch your condition.  Will get help right away if you are not doing well or get worse. Document Released: 06/13/2012 Document Reviewed: 06/13/2012 [facility] Patient Information 2014 [place], [place].   Emergency Department Resource Guide 1) Find a Doctor and Pay Out of Pocket Although you won't have to find out who is covered by your insurance plan, it is a good idea to ask around and get recommendations. You will then need to call the office and see if the doctor you have chosen will accept you as a new patient and what types of options they offer for patients who are self-pay. Some doctors offer discounts or will set up payment plans for their patients who do not have insurance, but you will need to ask  so you aren't surprised when you get to your appointment.  2) Contact Your Local Health Department Not all health departments have doctors that can see patients for sick visits, but many do, so it is worth a call to see if yours does. If you don't know where your local health department is, you can check in your phone book. The CDC also has a tool to help you locate your state's health department, and many state websites also have listings of all of their local health departments.  3) Find a [facility] If your illness is not likely to be very severe or complicated, you may want to try a walk in clinic. These are popping up all over the country in pharmacies, drugstores, and shopping centers. They're usually staffed by nurse practitioners or physician assistants that have been trained to treat common illnesses and complaints. They're usually fairly quick and inexpensive. However, if you have serious medical issues or chronic medical problems, these are probably not your best option.  No Primary Care Doctor: - Call Health Connect at  [phone] - they can help you locate a primary care doctor that  accepts your insurance, provides certain services, etc. - Physician Referral Service- [phone]  Chronic Pain Problems: Organization         Address  Phone   Notes  [facility]  [phone] Patients need to be referred by their primary care doctor.   Medication Assistance: Organization         Address  Phone   Notes  [facility] Med[facility] [address] [place], [place] [address] [phone] --Must be a resident of [facility] -- Must have NO insurance coverage whatsoever (no Medicaid/ Medicare, etc.) -- The pt. MUST have a primary care doctor that directs their care regularly and follows them in the community   MedAssist  [phone]   [employer]  [phone]    Agencies that provide inexpensive medical care: Organization         Address  Phone   Notes  [place]  [phone]   [facility]    [phone]   [facility] [address], [place] [address] [phone]   [place] [address], [place] [phone]   Planned Parenthood    [phone]   [facility]    [phone]   [place] and [place] [address], [place] Phone:  [phone], Fax:  [phone] Hours  of Operation:  9 am - 6 pm, M-F.  Also accepts Medicaid/Medicare and self-pay.  [facility] for [facility] [address], [address], [place] Phone: [phone], Fax: [phone]. Hours of Operation:  8:30 am - 5:30 pm, M-F.  Also accepts Medicaid and self-pay.  [facility] [place] [address], [place] Phone: [phone]   [place], [place], [place] [phone], Ext. [phone] Mondays & Thursdays: 7-9 AM.  First 15 patients are seen on a first come, first serve basis.    [place] Providers:  Organization         Address  Phone   Notes  [facility] [address], [place] [phone] Also accepts self-pay patients.  [place], [place], [place]  [phone]  [place], [address], [place] [phone]   [place] [address], [place] [phone]   Lucianne Lei [address], [place]   [phone] Only accepts [place] Access [place] patients after they have their name applied to their card.   Self-Pay (no insurance) in [facility]:  Organization         Address  Phone   Notes  Sickle Cell Patients, [facility] [place] [phone]   [facility]gent Care [place] [phone]   [facility] [place]  [place], [place], [place] [phone]   [facility]/Dr. Osei-Bonsu  [address] [place] or [address], [place] [phone] Phone number for both [place] and [place] locations is the same.  Urgent Medical and [facility] [address], [place] [phone]   [place] [address], [place] or [address] [phone]   [facility] [address] [place] [phone], phone; [phone], fax Sees patients 1st and 3rd Saturday of every month.  Must not qualify for public or private insurance (i.e. Medicaid, Medicare, [place] Health Choice, Veterans' Benefits)  Household income should be no more than 200% of the poverty level The clinic cannot treat you if you are pregnant or think you are pregnant  Sexually transmitted diseases are not treated at the clinic.    Dental Care: Organization         Address  Phone  Notes  [facility] Department of [facility] [place] [phone] Accepts children up to age 64 who are enrolled in [place] or [place]; pregnant women with a Medicaid card; and children who have applied for Medicaid or [place] Health Choice, but were declined, whose parents can pay a reduced fee at time of service.  [facility] Department of [facility]  [address], [place] [phone] Accepts children up to age 4 who are enrolled in [place] or [place]; pregnant women with a Medicaid card; and children who have applied for Medicaid or [place] Health Choice, but were declined, whose parents can pay a reduced fee at time of service.  [place] Adult Dental Access PROGRAM  [place] [phone] Patients are seen by appointment only. Walk-ins are not accepted. [place] will see patients 4 years of age and older. Monday - Tuesday (8am-5pm) Most Wednesdays (8:30-5pm) $30 per visit, cash only  [facility] Adult Dental Access PROGRAM  [address], [facility] [phone] Patients are seen by appointment only. Walk-ins are not accepted. [place] will see patients 44 years of age and older. One Wednesday Evening (Monthly: Volunteer Based).  $30 per visit, cash only  [place]  [phone] for adults; Children under age 48, call Graduate  Pediatric Dentistry at [phone]. Children aged 73-14, please call [phone] to request a pediatric application.  Dental services are provided in all areas of dental care including fillings, crowns and bridges, complete and partial dentures, implants, gum treatment, root canals, and extractions. Preventive care is also provided. Treatment is provided to both adults and children. Patients are selected via a lottery and there is often a waiting list.  [facility] [address], [place]  [phone] [email or web address]   [facility] [address] [place], [place] [phone], Ext. [phone] Second and Fourth Thursday of each month, opens at 6:30 AM; Clinic ends at 9 AM.  Patients are seen on a first-come first-served basis, and a limited number are seen during each clinic.   [facility]  [address] Hillard Danker [place], [place] [phone]   Eligibility Requirements You must have lived in [place], [place], or [place] counties for at least the last three months.   You cannot be eligible for state or federal sponsored [employer], including [employer], [place], or [employer].   You generally cannot be eligible for healthcare insurance through your employer.    How to apply: Eligibility screenings are held every Tuesday and Wednesday afternoon from 1:00 pm until 4:00 pm. You do not need an appointment for the interview!  [facility] [address], [place], [place]   [place]  [place] Department  [place]  [phone]    Behavioral Health Resources in the Community: Intensive Outpatient Programs Organization         Address  Phone  Notes  [place]. [address], [place], [place] [phone]   [facility]patient [address] [place], [place]   ADS: Alcohol & Drug Svcs [address] [place], [place]   Trainer [address]  [employer], [place] or [phone]   Substance Abuse Resources Organization         Address  Phone  Notes  Alcohol and Drug Services  [phone]   [place]  [phone]   [facility] [place]   Chinita Pester  [phone]   Residential & Outpatient Substance Abuse Program  [phone]   Psychological Services Organization         Address  Phone  Notes  [facility]  Campus  [phone]   [place] [address] [place] or [phone]    Mobile Crisis Teams Organization         Address  Phone  Notes  [facility], Mobile Crisis Care Unit  [phone]   [facility]ive Psychotherapeutic Services  [address] [place], [place]   [place] [address] [place] [phone]    Self-Help/Support Groups Organization         Address  Phone             Notes  [place]. of [place] - variety of support groups  [place] Call for more information  Narcotics Anonymous (NA), [facility]aring [facility] [address], [employer] [place]  2 meetings at this location   Special educational needs teacher         Address  Phone  Notes  [facility]idential [facility] [place],    [place]  [phone]   [place] [facility]  [address], [place] [address]37169, [place], [place]   [place], [place] [phone] Admissions: 8am-3pm M-F  Incentives Substance [place] [address],    [place], [place] [phone]   [facility] [address] [place], [place], [place]   The [place].,  [place], [place]   [facility] - Intensive Outpatient [address]een [address], [place], [place]   [facility] ([place] [address]  [place], [place] [phone] or [phone]   [facility] Services (RTS) [address] [place], [place] Accepts Medicaid  [facility] [place] [address],  [place] [phone] Substance Abuse/Addiction Treatment   [facility] Organization         Address  Phone  Notes  [facility]  [phone]   Domenic Schwab, PhD [address] Arlis [place], [place]   [phone] or [phone]   [place], [place] [phone]   [facility] [address], [place], [place] [phone] Insurance/Medicaid/sponsorship through [facility] and Families [address] [place]                                    [place], [place] [phone] [place] [address] [place], [place] [phone]    Dr. Adele Schilder  [phone]   [facility] [place] Dept. 1) [address], [place] 2) [place] 3)  [place] [address], [place] [phone]  [phone]   [place] [phone] or [phone] (After Hours)

## 2013-06-19 NOTE — ED Notes (Signed)
Per pt, history of hives.  Pt told to go to allergy testing but unable d/t insurance.  Pt has visible hives on arms.  Pt taking regular allergy meds.  No benadryl at home.

## 2013-06-20 NOTE — ED Provider Notes (Signed)
Medical screening examination/treatment/procedure(s) were performed by non-physician practitioner and as supervising physician I was immediately available for consultation/collaboration.   EKG Interpretation None       Ayasha Ellingsen, MD 06/20/13 0703 

## 2013-11-13 ENCOUNTER — Emergency Department (HOSPITAL_COMMUNITY)
Admission: EM | Admit: 2013-11-13 | Discharge: 2013-11-13 | Disposition: A | Discharge status: Home / Self Care | Payer: Medicaid Other | Attending: Emergency Medicine | Admitting: Emergency Medicine

## 2013-11-13 ENCOUNTER — Encounter (HOSPITAL_COMMUNITY): Payer: Self-pay | Admitting: Emergency Medicine

## 2013-11-13 DIAGNOSIS — Z79899 Other long term (current) drug therapy: Secondary | ICD-10-CM | POA: Insufficient documentation

## 2013-11-13 DIAGNOSIS — F17200 Nicotine dependence, unspecified, uncomplicated: Secondary | ICD-10-CM | POA: Insufficient documentation

## 2013-11-13 DIAGNOSIS — Z8659 Personal history of other mental and behavioral disorders: Secondary | ICD-10-CM | POA: Insufficient documentation

## 2013-11-13 DIAGNOSIS — R252 Cramp and spasm: Secondary | ICD-10-CM

## 2013-11-13 DIAGNOSIS — M62838 Other muscle spasm: Secondary | ICD-10-CM | POA: Insufficient documentation

## 2013-11-13 LAB — CBC WITH DIFFERENTIAL/PLATELET
Basophils Absolute: 0 10*3/uL (ref 0.0–0.1)
Basophils Relative: 0 % (ref 0–1)
EOS ABS: 0.4 10*3/uL (ref 0.0–0.7)
Eosinophils Relative: 3 % (ref 0–5)
HCT: 40.2 % (ref 36.0–46.0)
HEMOGLOBIN: 13.2 g/dL (ref 12.0–15.0)
LYMPHS ABS: 4.1 10*3/uL — AB (ref 0.7–4.0)
LYMPHS PCT: 36 % (ref 12–46)
MCH: 26.4 pg (ref 26.0–34.0)
MCHC: 32.8 g/dL (ref 30.0–36.0)
MCV: 80.4 fL (ref 78.0–100.0)
MONOS PCT: 8 % (ref 3–12)
Monocytes Absolute: 0.9 10*3/uL (ref 0.1–1.0)
NEUTROS ABS: 6.1 10*3/uL (ref 1.7–7.7)
NEUTROS PCT: 53 % (ref 43–77)
PLATELETS: 310 10*3/uL (ref 150–400)
RBC: 5 MIL/uL (ref 3.87–5.11)
RDW: 14.2 % (ref 11.5–15.5)
WBC: 11.5 10*3/uL — AB (ref 4.0–10.5)

## 2013-11-13 LAB — BASIC METABOLIC PANEL
Anion gap: 13 (ref 5–15)
BUN: 9 mg/dL (ref 6–23)
CHLORIDE: 104 meq/L (ref 96–112)
CO2: 22 mEq/L (ref 19–32)
Calcium: 9.1 mg/dL (ref 8.4–10.5)
Creatinine, Ser: 0.56 mg/dL (ref 0.50–1.10)
GFR calc Af Amer: >90 mL/min (ref >90)
GLUCOSE: 109 mg/dL — AB (ref 70–99)
POTASSIUM: 4.4 meq/L (ref 3.7–5.3)
SODIUM: 139 meq/L (ref 137–147)

## 2013-11-13 LAB — MAGNESIUM: MAGNESIUM: 1.7 mg/dL (ref 1.5–2.5)

## 2013-11-13 LAB — CK: CK TOTAL: 102 U/L (ref 7–177)

## 2013-11-13 MED ORDER — DIAZEPAM 5 MG PO TABS: 5.0000 mg | ORAL_TABLET | Freq: Every evening | ORAL | Status: DC | PRN

## 2013-11-13 NOTE — ED Notes (Signed)
Pt states she is having muscle spasms in her arms and legs bilaterally.

## 2013-11-13 NOTE — ED Provider Notes (Signed)
CSN: [id number]     Arrival date & time 11/13/13  4098 History   First MD Initiated Contact with Patient 11/13/13 [phone]     Chief Complaint  Patient presents with  . Spasms     (Consider location/radiation/quality/duration/timing/severity/associated sxs/prior Treatment) HPI Judith Lee is a 18 y.o. female who presents to emergency department complaining of cramps in arms and legs at nighttime. Patient states symptoms have been going on for several years, but worsened in the last few weeks it helped at almost constant since 11 PM last night. She states the cramps are painful, but reports that her calves, thighs, her shoulders, hands. States they improve with stretching. She reports taking her Dr. for similar event several years ago and was told that she may have anxiety. She denies any strenuous activity. She denies dehydration. She has not tried any medications for this. She is on birth control, Implanon, no other medications. Currently asymptomatic.  Past Medical History  Diagnosis Date  . Panic attack   . Anxiety    Past Surgical History  Procedure Laterality Date  . None    . Wisdom tooth extraction     Family History  Problem Relation Age of Onset  . Diabetes Mother   . Hypertension Mother   . Diabetes Father   . Hypertension Father    History  Substance Use Topics  . Smoking status: Current Some Day Smoker  . Smokeless tobacco: Never Used  . Alcohol Use: No   OB History   Grav Para Term Preterm Abortions TAB SAB Ect Mult Living       Review of Systems  Constitutional: Negative for fever and chills.  Respiratory: Negative for cough, chest tightness and shortness of breath.   Cardiovascular: Negative for chest pain, palpitations and leg swelling.  Gastrointestinal: Negative for nausea, vomiting, abdominal pain and diarrhea.  Musculoskeletal: Positive for myalgias. Negative for neck pain and neck stiffness.  Skin: Negative for rash.   Neurological: Negative for dizziness, weakness and headaches.  All other systems reviewed and are negative.     Allergies  Review of patient's allergies indicates no known allergies.  Home Medications   Prior to Admission medications   Medication Sig Start Date End Date Taking? Authorizing Provider  etonogestrel (IMPLANON) 68 MG IMPL implant Inject 1 each into the skin once. 10/13/12  Yes Historical Provider, MD  ibuprofen (ADVIL,MOTRIN) 200 MG tablet Take 200 mg by mouth every 6 (six) hours as needed for moderate pain.   Yes Historical Provider, MD   BP 129/82  Pulse 81  Temp(Src) 98.5 F (36.9 C) (Oral)  Resp 17  Ht  (1.6 m)  Wt 235 lb (106.595 kg)  BMI 41.64 kg/m2  SpO2 97% Physical Exam  Nursing note and vitals reviewed. Constitutional: She is oriented to person, place, and time. She appears well-developed and well-nourished. No distress.  HENT:  Head: Normocephalic.  Eyes: Conjunctivae are normal.  Neck: Neck supple.  Cardiovascular: Normal rate, regular rhythm and normal heart sounds.   Pulmonary/Chest: Effort normal and breath sounds normal. No respiratory distress. She has no wheezes. She has no rales.  Abdominal: Soft. Bowel sounds are normal. She exhibits no distension. There is no tenderness. There is no rebound.  Musculoskeletal: She exhibits no edema.  Neurological: She is alert and oriented to person, place, and time.  Skin: Skin is warm and dry.  Psychiatric: She has a normal mood and affect. Her behavior  is normal.    ED Course  Procedures (including critical care time) Labs Review Labs Reviewed  CBC WITH DIFFERENTIAL - Abnormal; Notable for the following:    WBC 11.5 (*)    Lymphs Abs 4.1 (*)    All other components within normal limits  BASIC METABOLIC PANEL - Abnormal; Notable for the following:    Glucose, Bld 109 (*)    All other components within normal limits  CK  MAGNESIUM    Imaging Review No results found.   EKG  Interpretation None      MDM   Final diagnoses:  Muscle cramps at night   Pt with upper and lower extremity muscle spasms, mainly at night. Will check electrolytes and renal function. Currently asymptomatic. Pt is otherwise healthy, not on any medications that would cause this  Pt asymptomatic in ED. States only happens at night. Labs unremarkable. Home with valium for pain and spasms. Advised to start increasing physical activity. Stretch and roll out muscles before bed time. Follow up with pcp. Pt voiced understanding.   Filed Vitals:   11/13/13 0519  BP: 129/82  Pulse: 81  Temp: 98.5 F (36.9 C)  TempSrc: Oral  Resp: 17  Height:  (1.6 m)  Weight: 235 lb (106.595 kg)  SpO2: 97%     Lottie Mussel, PA-C 11/13/13 [phone]

## 2013-11-13 NOTE — Discharge Instructions (Signed)
Valium for spasms at night time. Try excercising, rolling muscles out before bed time. Follow up with primary care doctor.     Leg Cramps Leg cramps that occur during exercise can be caused by poor circulation or dehydration. However, muscle cramps that occur at rest or during the night are usually not due to any serious medical problem. Heat cramps may cause muscle spasms during hot weather.  CAUSES There is no clear cause for muscle cramps. However, dehydration may be a factor for those who do not drink enough fluids and those who exercise in the heat. Imbalances in the level of sodium, potassium, calcium or magnesium in the muscle tissue may also be a factor. Some medications, such as water pills (diuretics), may cause loss of chemicals that the body needs (like sodium and potassium) and cause muscle cramps. TREATMENT   Make sure your diet has enough fluids and essential minerals for the muscle to work normally.  Avoid strenuous exercise for several days if you have been having frequent leg cramps.  Stretch and massage the cramped muscle for several minutes.  Some medicines may be helpful in some patients with night cramps. Only take over-the-counter or prescription medicines as directed by your caregiver. SEEK IMMEDIATE MEDICAL CARE IF:   Your leg cramps become worse.  Your foot becomes cold, numb, or blue. Document Released: 03/26/2004 Document Revised: 05/11/2011 Document Reviewed: 03/13/2008 [facility] Patient Information 2015 [place], [place]. This information is not intended to replace advice given to you by your health care provider. Make sure you discuss any questions you have with your health care provider.  Muscle Cramps and Spasms Muscle cramps and spasms occur when a muscle or muscles tighten and you have no control over this tightening (involuntary muscle contraction). They are a common problem and can develop in any muscle. The most common place is in the calf muscles of the  leg. Both muscle cramps and muscle spasms are involuntary muscle contractions, but they also have differences:   Muscle cramps are sporadic and painful. They may last a few seconds to a quarter of an hour. Muscle cramps are often more forceful and last longer than muscle spasms.  Muscle spasms may or may not be painful. They may also last just a few seconds or much longer. CAUSES  It is uncommon for cramps or spasms to be due to a serious underlying problem. In many cases, the cause of cramps or spasms is unknown. Some common causes are:   Overexertion.   Overuse from repetitive motions (doing the same thing over and over).   Remaining in a certain position for a long period of time.   Improper preparation, form, or technique while performing a sport or activity.   Dehydration.   Injury.   Side effects of some medicines.   Abnormally low levels of the salts and ions in your blood (electrolytes), especially potassium and calcium. This could happen if you are taking water pills (diuretics) or you are pregnant.  Some underlying medical problems can make it more likely to develop cramps or spasms. These include, but are not limited to:   Diabetes.   Parkinson disease.   Hormone disorders, such as thyroid problems.   Alcohol abuse.   Diseases specific to muscles, joints, and bones.   Blood vessel disease where not enough blood is getting to the muscles.  HOME CARE INSTRUCTIONS   Stay well hydrated. Drink enough water and fluids to keep your urine clear or pale yellow.  It may  be helpful to massage, stretch, and relax the affected muscle.  For tight or tense muscles, use a warm towel, heating pad, or hot shower water directed to the affected area.  If you are sore or have pain after a cramp or spasm, applying ice to the affected area may relieve discomfort.  Put ice in a plastic bag.  Place a towel between your skin and the bag.  Leave the ice on for 15-20  minutes, 03-04 times a day.  Medicines used to treat a known cause of cramps or spasms may help reduce their frequency or severity. Only take over-the-counter or prescription medicines as directed by your caregiver. SEEK MEDICAL CARE IF:  Your cramps or spasms get more severe, more frequent, or do not improve over time.  MAKE SURE YOU:   Understand these instructions.  Will watch your condition.  Will get help right away if you are not doing well or get worse. Document Released: 08/08/2001 Document Revised: 06/13/2012 Document Reviewed: 02/03/2012 [facility] Patient Information 2015 [place], [place]. This information is not intended to replace advice given to you by your health care provider. Make sure you discuss any questions you have with your health care provider.

## 2013-11-13 NOTE — ED Provider Notes (Signed)
Medical screening examination/treatment/procedure(s) were performed by non-physician practitioner and as supervising physician I was immediately available for consultation/collaboration.   EKG Interpretation None        Tomasita Crumble, MD 11/13/13 1636

## 2014-01-01 ENCOUNTER — Encounter (HOSPITAL_COMMUNITY): Payer: Self-pay | Admitting: Emergency Medicine

## 2014-06-27 ENCOUNTER — Encounter (HOSPITAL_COMMUNITY): Payer: Self-pay | Admitting: *Deleted

## 2014-06-27 ENCOUNTER — Emergency Department (HOSPITAL_COMMUNITY)
Admission: EM | Admit: 2014-06-27 | Discharge: 2014-06-27 | Disposition: A | Discharge status: Home / Self Care | Payer: 59 | Attending: Emergency Medicine | Admitting: Emergency Medicine

## 2014-06-27 DIAGNOSIS — Z79899 Other long term (current) drug therapy: Secondary | ICD-10-CM | POA: Diagnosis not present

## 2014-06-27 DIAGNOSIS — F410 Panic disorder [episodic paroxysmal anxiety] without agoraphobia: Secondary | ICD-10-CM | POA: Insufficient documentation

## 2014-06-27 DIAGNOSIS — M6282 Rhabdomyolysis: Secondary | ICD-10-CM | POA: Diagnosis not present

## 2014-06-27 DIAGNOSIS — M62838 Other muscle spasm: Secondary | ICD-10-CM | POA: Diagnosis not present

## 2014-06-27 DIAGNOSIS — Z720 Tobacco use: Secondary | ICD-10-CM | POA: Diagnosis not present

## 2014-06-27 DIAGNOSIS — M79651 Pain in right thigh: Secondary | ICD-10-CM | POA: Diagnosis present

## 2014-06-27 LAB — BASIC METABOLIC PANEL
ANION GAP: 6 (ref 5–15)
BUN: 12 mg/dL (ref 6–23)
CALCIUM: 9 mg/dL (ref 8.4–10.5)
CHLORIDE: 109 mmol/L (ref 96–112)
CO2: 21 mmol/L (ref 19–32)
CREATININE: 0.65 mg/dL (ref 0.50–1.10)
GFR calc non Af Amer: >90 mL/min (ref >90)
Glucose, Bld: 112 mg/dL — ABNORMAL HIGH (ref 70–99)
Potassium: 3.8 mmol/L (ref 3.5–5.1)
SODIUM: 136 mmol/L (ref 135–145)

## 2014-06-27 LAB — CK: CK TOTAL: 1456 U/L — AB (ref 7–177)

## 2014-06-27 MED ORDER — SODIUM CHLORIDE 0.9 % IV BOLUS (SEPSIS)
1000.0000 mL | Freq: Once | INTRAVENOUS | Status: AC
  Administered 2014-06-27: 1000 mL via INTRAVENOUS

## 2014-06-27 MED ORDER — IBUPROFEN 800 MG PO TABS: 800.0000 mg | ORAL_TABLET | Freq: Three times a day (TID) | ORAL | Status: DC

## 2014-06-27 MED ORDER — KETOROLAC TROMETHAMINE 30 MG/ML IJ SOLN
30.0000 mg | Freq: Once | INTRAMUSCULAR | Status: AC
  Administered 2014-06-27: 30 mg via INTRAVENOUS
  Filled 2014-06-27: qty 1

## 2014-06-27 NOTE — ED Notes (Signed)
Patient states she is experiencing muscle spasms in her legs and arms, mostly when she lays down. Pt references it to a restlessness. Pt reports she has had these symptoms before, last none 3 weeks ago. Pt reports she started working out 2 weeks ago but nothing different while on last night's work out session.

## 2014-06-27 NOTE — ED Notes (Signed)
MD at bedside. 

## 2014-06-27 NOTE — ED Provider Notes (Signed)
CSN: [id number]     Arrival date & time 06/27/14  4098 History   First MD Initiated Contact with Patient 06/27/14 [phone]     Chief Complaint  Patient presents with  . Spasms     (Consider location/radiation/quality/duration/timing/severity/associated sxs/prior Treatment) HPI Comments: The patient is a 19 year old female who presents to the hospital from home after she had undergone more exercise than usual. She started exercising 2 weeks ago, yesterday did more intense cardiac activity, felt as though her legs were sore and last night when she got home had increased soreness and muscle spasms in her bilateral thighs and bilateral arms, she felt as though her arms were jerking, cramping, this is gradually eased off and is now very mild. She denies any other symptoms including fevers chills nausea vomiting coughing or shortness of breath chest pain back pain neck pain and headache. She has no numbness or weakness, has been able to ambulate with minimal difficulty only because of the cramping in her lower extremities. She states that she has had these problems and she was a little girl with cramping. She takes no medications for that. She does take medications for mood stability  The history is provided by the patient.    Past Medical History  Diagnosis Date  . Panic attack   . Anxiety    Past Surgical History  Procedure Laterality Date  . None    . Wisdom tooth extraction     Family History  Problem Relation Age of Onset  . Diabetes Mother   . Hypertension Mother   . Diabetes Father   . Hypertension Father    History  Substance Use Topics  . Smoking status: Current Some Day Smoker  . Smokeless tobacco: Never Used  . Alcohol Use: No   OB History    Gravida Para Term Preterm AB TAB SAB Ectopic Multiple Living       Review of Systems  All other systems reviewed and are negative.     Allergies  Review of patient's allergies indicates no known  allergies.  Home Medications   Prior to Admission medications   Medication Sig Start Date End Date Taking? Authorizing Provider  diphenhydrAMINE (BENADRYL) 50 MG tablet Take 50 mg by mouth daily at 12 noon.   Yes Historical Provider, MD  etonogestrel (IMPLANON) 68 MG IMPL implant Inject 1 each into the skin once. 10/13/12  Yes Historical Provider, MD  lamoTRIgine (LAMICTAL) 25 MG tablet Take 25 mg by mouth 2 (two) times daily.   Yes Historical Provider, MD  Multiple Vitamin (MULTIVITAMIN) tablet Take 1 tablet by mouth daily.   Yes Historical Provider, MD  QUEtiapine (SEROQUEL) 100 MG tablet Take 100 mg by mouth at bedtime.   Yes Historical Provider, MD  diazepam (VALIUM) 5 MG tablet Take 1 tablet (5 mg total) by mouth at bedtime as needed for anxiety or muscle spasms. Patient not taking: Reported on 06/27/2014 11/13/13   Tatyana Kirichenko, PA-C  ibuprofen (ADVIL,MOTRIN) 800 MG tablet Take 1 tablet (800 mg total) by mouth 3 (three) times daily. 06/27/14   Eber Hong, MD   BP 138/63 mmHg  Pulse 74  Temp(Src) 98.1 F (36.7 C) (Oral)  Resp 18  SpO2 98% Physical Exam  Constitutional: She appears well-developed and well-nourished. No distress.  HENT:  Head: Normocephalic and atraumatic.  Mouth/Throat: Oropharynx is clear and moist. No oropharyngeal exudate.  Eyes: Conjunctivae and EOM are normal. Pupils are equal, round,  and reactive to light. Right eye exhibits no discharge. Left eye exhibits no discharge. No scleral icterus.  Neck: Normal range of motion. Neck supple. No JVD present. No thyromegaly present.  Cardiovascular: Normal rate, regular rhythm, normal heart sounds and intact distal pulses.  Exam reveals no gallop and no friction rub.   No murmur heard. Pulmonary/Chest: Effort normal and breath sounds normal. No respiratory distress. She has no wheezes. She has no rales.  Abdominal: Soft. Bowel sounds are normal. She exhibits no distension and no mass. There is no tenderness.   Musculoskeletal: Normal range of motion. She exhibits tenderness ( ttp in the bialteral thighs and shoulders - soft compartments, supple joints). She exhibits no edema.  Lymphadenopathy:    She has no cervical adenopathy.  Neurological: She is alert. Coordination normal.  Skin: Skin is warm and dry. No rash noted. No erythema.  Psychiatric: She has a normal mood and affect. Her behavior is normal.  Nursing note and vitals reviewed.   ED Course  Procedures (including critical care time) Labs Review Labs Reviewed  CK - Abnormal; Notable for the following:    Total CK 1456 (*)    All other components within normal limits  BASIC METABOLIC PANEL - Abnormal; Notable for the following:    Glucose, Bld 112 (*)    All other components within normal limits    Imaging Review No results found.    MDM   Final diagnoses:  Muscle spasms of both lower extremities  Non-traumatic rhabdomyolysis    Normal vital signs, tenderness in the muscles, no signs of compartment syndrome, consider rhabdo, hypokalemia, likely benign.  Pt made aware of findings - fluids given, encouraged recheck of CK in one week, pt in agreement.  Meds given in ED:  Medications  sodium chloride 0.9 % bolus 1,000 mL (1,000 mLs Intravenous New Bag/Given 06/27/14 0936)  sodium chloride 0.9 % bolus 1,000 mL (0 mLs Intravenous Stopped 06/27/14 0936)  ketorolac (TORADOL) 30 MG/ML injection 30 mg (30 mg Intravenous Given 06/27/14 0841)    New Prescriptions   IBUPROFEN (ADVIL,MOTRIN) 800 MG TABLET    Take 1 tablet (800 mg total) by mouth 3 (three) times daily.    e  Eber HongBrian Seng Fouts, MD 06/27/14 1026

## 2014-06-27 NOTE — Discharge Instructions (Signed)
You MUST drink plenty of fluids, you MUST get your blood work rechecked in one week - please exercise no more than 20 minutes of mild exercise at a time.  Please call your doctor for a followup appointment within 24-48 hours. When you talk to your doctor please let them know that you were seen in the emergency department and have them acquire all of your records so that they can discuss the findings with you and formulate a treatment plan to fully care for your new and ongoing problems.

## 2014-06-27 NOTE — ED Notes (Signed)
Pt admits to muscle spasms and pain that began last night, pt admits to an extensive workout performed yesterday and unsure if this has attributed to her pain. Pt denies any other complaints, states pain has kept her from being able to sleep.

## 2015-03-03 NOTE — L&D Delivery Note (Signed)
Delivery Note At 7:16 AM a viable female was delivered via Vaginal, Spontaneous Delivery (Presentation: vtx; LOA ).  APGAR: 7, 9; weight pending.   Placenta status: spontaneous, intact.  Cord:  with the following complications: nuchal cord x 2 reduced.  Anesthesia:  Epidural Episiotomy: None Lacerations: None Suture Repair: none Est. Blood Loss (mL):  400  Mom to postpartum.  Baby to Couplet care / Skin to Skin.  Olina Melfi D 11/16/2015, 7:28 AM

## 2015-03-12 ENCOUNTER — Encounter (HOSPITAL_COMMUNITY): Payer: Self-pay | Admitting: Medical

## 2015-03-12 ENCOUNTER — Inpatient Hospital Stay (HOSPITAL_COMMUNITY)
Admission: AD | Admit: 2015-03-12 | Discharge: 2015-03-12 | Disposition: A | Discharge status: Home / Self Care | Payer: Medicaid Other | Source: Ambulatory Visit | Attending: Obstetrics & Gynecology | Admitting: Obstetrics & Gynecology

## 2015-03-12 DIAGNOSIS — Z3A01 Less than 8 weeks gestation of pregnancy: Secondary | ICD-10-CM | POA: Insufficient documentation

## 2015-03-12 DIAGNOSIS — Z3201 Encounter for pregnancy test, result positive: Secondary | ICD-10-CM

## 2015-03-12 LAB — URINALYSIS, ROUTINE W REFLEX MICROSCOPIC
Bilirubin Urine: NEGATIVE
Glucose, UA: NEGATIVE mg/dL
Ketones, ur: NEGATIVE mg/dL
NITRITE: NEGATIVE
PH: 6 (ref 5.0–8.0)
Protein, ur: NEGATIVE mg/dL
Specific Gravity, Urine: <1.005 — ABNORMAL LOW (ref 1.005–1.030)

## 2015-03-12 LAB — URINE MICROSCOPIC-ADD ON

## 2015-03-12 LAB — POCT PREGNANCY, URINE: Preg Test, Ur: POSITIVE — AB

## 2015-03-12 NOTE — MAU Note (Signed)
Pt states that she had a +upt today at home. LMP: 02/11/2015. Denies pain or bleeding. Pt just wants confirmation of pregnancy and to see how far a long she is.

## 2015-03-12 NOTE — Discharge Instructions (Signed)
Prenatal Care °WHAT IS PRENATAL CARE?  °Prenatal care is the process of caring for a pregnant woman before she gives birth. Prenatal care makes sure that she and her baby remain as healthy as possible throughout pregnancy. Prenatal care may be provided by a midwife, family practice health care provider, or a childbirth and pregnancy specialist (obstetrician). Prenatal care may include physical examinations, testing, treatments, and education on nutrition, lifestyle, and social support services. °WHY IS PRENATAL CARE SO IMPORTANT?  °Early and consistent prenatal care increases the chance that you and your baby will remain healthy throughout your pregnancy. This type of care also decreases a baby's risk of being born too early (prematurely), or being born smaller than expected (small for gestational age). Any underlying medical conditions you may have that could pose a risk during your pregnancy are discussed during prenatal care visits. You will also be monitored regularly for any new conditions that may arise during your pregnancy so they can be treated quickly and effectively. °WHAT HAPPENS DURING PRENATAL CARE VISITS? °Prenatal care visits may include the following: °Discussion °Tell your health care provider about any new signs or symptoms you have experienced since your last visit. These might include: °· Nausea or vomiting. °· Increased or decreased level of energy. °· Difficulty sleeping. °· Back or leg pain. °· Weight changes. °· Frequent urination. °· Shortness of breath with physical activity. °· Changes in your skin, such as the development of a rash or itchiness. °· Vaginal discharge or bleeding. °· Feelings of excitement or nervousness. °· Changes in your baby's movements. °You may want to write down any questions or topics you want to discuss with your health care provider and bring them with you to your appointment. °Examination °During your first prenatal care visit, you will likely have a complete  physical exam. Your health care provider will often examine your vagina, cervix, and the position of your uterus, as well as check your heart, lungs, and other body systems. As your pregnancy progresses, your health care provider will measure the size of your uterus and your baby's position inside your uterus. He or she may also examine you for early signs of labor. Your prenatal visits may also include checking your blood pressure and, after about 10-12 weeks of pregnancy, listening to your baby's heartbeat. °Testing °Regular testing often includes: °· Urinalysis. This checks your urine for glucose, protein, or signs of infection. °· Blood count. This checks the levels of white and red blood cells in your body. °· Tests for sexually transmitted infections (STIs). Testing for STIs at the beginning of pregnancy is routinely done and is required in many states. °· Antibody testing. You will be checked to see if you are immune to certain illnesses, such as rubella, that can affect a developing fetus. °· Glucose screen. Around 24-28 weeks of pregnancy, your blood glucose level will be checked for signs of gestational diabetes. Follow-up tests may be recommended. °· Group B strep. This is a bacteria that is commonly found inside a woman's vagina. This test will inform your health care provider if you need an antibiotic to reduce the amount of this bacteria in your body prior to labor and childbirth. °· Ultrasound. Many pregnant women undergo an ultrasound screening around 18-20 weeks of pregnancy to evaluate the health of the fetus and check for any developmental abnormalities. °· HIV (human immunodeficiency virus) testing. Early in your pregnancy, you will be screened for HIV. If you are at high risk for HIV, this test   may be repeated during your third trimester of pregnancy. You may be offered other testing based on your age, personal or family medical history, or other factors.  HOW OFTEN SHOULD I PLAN TO SEE MY  HEALTH CARE PROVIDER FOR PRENATAL CARE? Your prenatal care check-up schedule depends on any medical conditions you have before, or develop during, your pregnancy. If you do not have any underlying medical conditions, you will likely be seen for checkups:  Monthly, during the first 6 months of pregnancy.  Twice a month during months 7 and 8 of pregnancy.  Weekly starting in the 9th month of pregnancy and until delivery. If you develop signs of early labor or other concerning signs or symptoms, you may need to see your health care provider more often. Ask your health care provider what prenatal care schedule is best for you. WHAT CAN I DO TO KEEP MYSELF AND MY BABY AS HEALTHY AS POSSIBLE DURING MY PREGNANCY?  Take a prenatal vitamin containing 400 micrograms (0.4 mg) of folic acid every day. Your health care provider may also ask you to take additional vitamins such as iodine, vitamin D, iron, copper, and zinc.  Take 1500-2000 mg of calcium daily starting at your 20th week of pregnancy until you deliver your baby.  Make sure you are up to date on your vaccinations. Unless directed otherwise by your health care provider:  You should receive a tetanus, diphtheria, and pertussis (Tdap) vaccination between the 27th and 36th week of your pregnancy, regardless of when your last Tdap immunization occurred. This helps protect your baby from whooping cough (pertussis) after he or she is born.  You should receive an annual inactivated influenza vaccine (IIV) to help protect you and your baby from influenza. This can be done at any point during your pregnancy.  Eat a well-rounded diet that includes:  Fresh fruits and vegetables.  Lean proteins.  Calcium-rich foods such as milk, yogurt, hard cheeses, and dark, leafy greens.  Whole grain breads.  Do noteat seafood high in mercury, including:  Swordfish.  Tilefish.  Shark.  King mackerel.  More than 6 oz tuna per week.  Do not eat:  Raw  or undercooked meats or eggs.  Unpasteurized foods, such as soft cheeses (brie, blue, or feta), juices, and milks.  Lunch meats.  Hot dogs that have not been heated until they are steaming.  Drink enough water to keep your urine clear or pale yellow. For many women, this may be 10 or more 8 oz glasses of water each day. Keeping yourself hydrated helps deliver nutrients to your baby and may prevent the start of pre-term uterine contractions.  Do not use any tobacco products including cigarettes, chewing tobacco, or electronic cigarettes. If you need help quitting, ask your health care provider.  Do not drink beverages containing alcohol. No safe level of alcohol consumption during pregnancy has been determined.  Do not use any illegal drugs. These can harm your developing baby or cause a miscarriage.  Ask your health care provider or pharmacist before taking any prescription or over-the-counter medicines, herbs, or supplements.  Limit your caffeine intake to no more than 200 mg per day.  Exercise. Unless told otherwise by your health care provider, try to get 30 minutes of moderate exercise most days of the week. Do not  do high-impact activities, contact sports, or activities with a high risk of falling, such as horseback riding or downhill skiing.  Get plenty of rest.  Avoid anything that raises your  body temperature, such as hot tubs and saunas.  If you own a cat, do not empty its litter box. Bacteria contained in cat feces can cause an infection called toxoplasmosis. This can result in serious harm to the fetus.  Stay away from chemicals such as insecticides, lead, mercury, and cleaning or paint products that contain solvents.  Do not have any X-rays taken unless medically necessary.  Take a childbirth and breastfeeding preparation class. Ask your health care provider if you need a referral or recommendation.   This information is not intended to replace advice given to you by  your health care provider. Make sure you discuss any questions you have with your health care provider.   Document Released: 02/19/2003 Document Revised: 03/09/2014 Document Reviewed: 05/03/2013 Elsevier Interactive Patient Education 2016 [employer] First Trimester of Pregnancy The first trimester of pregnancy is from week 1 until the end of week 12 (months 1 through 3). During this time, your baby will begin to develop inside you. At 6-8 weeks, the eyes and face are formed, and the heartbeat can be seen on ultrasound. At the end of 12 weeks, all the baby's organs are formed. Prenatal care is all the medical care you receive before the birth of your baby. Make sure you get good prenatal care and follow all of your doctor's instructions. HOME CARE  Medicines  Take medicine only as told by your doctor. Some medicines are safe and some are not during pregnancy.  Take your prenatal vitamins as told by your doctor.  Take medicine that helps you poop (stool softener) as needed if your doctor says it is okay. Diet  Eat regular, healthy meals.  Your doctor will tell you the amount of weight gain that is right for you.  Avoid raw meat and uncooked cheese.  If you feel sick to your stomach (nauseous) or throw up (vomit):  Eat 4 or 5 small meals a day instead of 3 large meals.  Try eating a few soda crackers.  Drink liquids between meals instead of during meals.  If you have a hard time pooping (constipation):  Eat high-fiber foods like fresh vegetables, fruit, and whole grains.  Drink enough fluids to keep your pee (urine) clear or pale yellow. Activity and Exercise  Exercise only as told by your doctor. Stop exercising if you have cramps or pain in your lower belly (abdomen) or low back.  Try to avoid standing for long periods of time. Move your legs often if you must stand in one place for a long time.  Avoid heavy lifting.  Wear low-heeled shoes. Sit and stand up  straight.  You can have sex unless your doctor tells you not to. Relief of Pain or Discomfort  Wear a good support bra if your breasts are sore.  Take warm water baths (sitz baths) to soothe pain or discomfort caused by hemorrhoids. Use hemorrhoid cream if your doctor says it is okay.  Rest with your legs raised if you have leg cramps or low back pain.  Wear support hose if you have puffy, bulging veins (varicose veins) in your legs. Raise (elevate) your feet for 15 minutes, 3-4 times a day. Limit salt in your diet. Prenatal Care  Schedule your prenatal visits by the twelfth week of pregnancy.  Write down your questions. Take them to your prenatal visits.  Keep all your prenatal visits as told by your doctor. Safety  Wear your seat belt at all times when driving.  Make a  list of emergency phone numbers. The list should include numbers for family, friends, the hospital, and police and fire departments. General Tips  Ask your doctor for a referral to a local prenatal class. Begin classes no later than at the start of month 6 of your pregnancy.  Ask for help if you need counseling or help with nutrition. Your doctor can give you advice or tell you where to go for help.  Do not use hot tubs, steam rooms, or saunas.  Do not douche or use tampons or scented sanitary pads.  Do not cross your legs for long periods of time.  Avoid litter boxes and soil used by cats.  Avoid all smoking, herbs, and alcohol. Avoid drugs not approved by your doctor.  Do not use any tobacco products, including cigarettes, chewing tobacco, and electronic cigarettes. If you need help quitting, ask your doctor. You may get counseling or other support to help you quit.  Visit your dentist. At home, brush your teeth with a soft toothbrush. Be gentle when you floss. GET HELP IF:  You are dizzy.  You have mild cramps or pressure in your lower belly.  You have a nagging pain in your belly area.  You  continue to feel sick to your stomach, throw up, or have watery poop (diarrhea).  You have a bad smelling fluid coming from your vagina.  You have pain with peeing (urination).  You have increased puffiness (swelling) in your face, hands, legs, or ankles. GET HELP RIGHT AWAY IF:   You have a fever.  You are leaking fluid from your vagina.  You have spotting or bleeding from your vagina.  You have very bad belly cramping or pain.  You gain or lose weight rapidly.  You throw up blood. It may look like coffee grounds.  You are around people who have [place] measles, fifth disease, or chickenpox.  You have a very bad headache.  You have shortness of breath.  You have any kind of trauma, such as from a fall or a car accident.   This information is not intended to replace advice given to you by your health care provider. Make sure you discuss any questions you have with your health care provider.   Document Released: 08/05/2007 Document Revised: 03/09/2014 Document Reviewed: 12/27/2012 Elsevier Interactive Patient Education [employer]

## 2015-03-12 NOTE — MAU Provider Note (Signed)
Judith Lee is a 20 y.o. G2P0101 at 5897w1d who presents to MAU today for pregnancy verification. The patient denies abdominal pain or vaginal bleeding today.   BP 138/92 mmHg  Pulse 89  Temp(Src) 98.3 F (36.8 C) (Oral)  Resp 18  Ht 5\' 4"  (1.626 m)  Wt 241 lb 12.8 oz (109.68 kg)  BMI 41.48 kg/m2  SpO2 98%  LMP 02/11/2015  CONSTITUTIONAL: Well-developed, well-nourished female in no acute distress.  ENT: External right and left ear normal.  EYES: EOM intact, conjunctivae normal.  MUSCULOSKELETAL: Normal range of motion.  CARDIOVASCULAR: Regular heart rate RESPIRATORY: Normal effort NEUROLOGICAL: Alert and oriented to person, place, and time.  SKIN: Skin is warm and dry. No rash noted. Not diaphoretic. No erythema. No pallor. PSYCH: Normal mood and affect. Normal behavior. Normal judgment and thought content.  Results for orders placed or performed during the hospital encounter of 03/12/15 (from the past 24 hour(s))  Pregnancy, urine POC     Status: Abnormal   Collection Time: 03/12/15  8:52 PM  Result Value Ref Range   Preg Test, Ur POSITIVE (A) NEGATIVE    A: Positive pregnancy test  P: Discharge home Pregnancy confirmation letter and list of area OB providers given Patient advised to start taking prenatal vitamins First trimester warning signs reviewed Patient may return to MAU as needed or if her condition were to change or worsen   Marny LowensteinJulie N Wenzel, PA-C  03/12/2015 8:53 PM

## 2015-08-20 ENCOUNTER — Encounter (HOSPITAL_COMMUNITY): Payer: Self-pay | Admitting: *Deleted

## 2015-08-20 ENCOUNTER — Inpatient Hospital Stay (HOSPITAL_COMMUNITY)
Admission: AD | Admit: 2015-08-20 | Discharge: 2015-08-21 | Disposition: A | Discharge status: Home / Self Care | Payer: No Typology Code available for payment source | Source: Ambulatory Visit | Attending: Obstetrics and Gynecology | Admitting: Obstetrics and Gynecology

## 2015-08-20 DIAGNOSIS — F419 Anxiety disorder, unspecified: Secondary | ICD-10-CM | POA: Insufficient documentation

## 2015-08-20 DIAGNOSIS — O26892 Other specified pregnancy related conditions, second trimester: Secondary | ICD-10-CM | POA: Insufficient documentation

## 2015-08-20 DIAGNOSIS — O368130 Decreased fetal movements, third trimester, not applicable or unspecified: Secondary | ICD-10-CM | POA: Diagnosis not present

## 2015-08-20 DIAGNOSIS — Z3A37 37 weeks gestation of pregnancy: Secondary | ICD-10-CM | POA: Insufficient documentation

## 2015-08-20 DIAGNOSIS — O99342 Other mental disorders complicating pregnancy, second trimester: Secondary | ICD-10-CM | POA: Insufficient documentation

## 2015-08-20 DIAGNOSIS — T148 Other injury of unspecified body region: Secondary | ICD-10-CM | POA: Insufficient documentation

## 2015-08-20 DIAGNOSIS — Z3A27 27 weeks gestation of pregnancy: Secondary | ICD-10-CM

## 2015-08-20 DIAGNOSIS — S161XXA Strain of muscle, fascia and tendon at neck level, initial encounter: Secondary | ICD-10-CM

## 2015-08-20 DIAGNOSIS — O9A212 Injury, poisoning and certain other consequences of external causes complicating pregnancy, second trimester: Secondary | ICD-10-CM

## 2015-08-20 DIAGNOSIS — S199XXA Unspecified injury of neck, initial encounter: Secondary | ICD-10-CM

## 2015-08-20 DIAGNOSIS — Z87891 Personal history of nicotine dependence: Secondary | ICD-10-CM | POA: Insufficient documentation

## 2015-08-20 MED ORDER — ACETAMINOPHEN-CODEINE #3 300-30 MG PO TABS: 1.0000 | ORAL_TABLET | Freq: Four times a day (QID) | ORAL | Status: DC | PRN
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
## 2015-08-20 NOTE — Discharge Instructions (Signed)
Muscle Strain A muscle strain (pulled muscle) happens when a muscle is stretched beyond normal length. It happens when a sudden, violent force stretches your muscle too far. Usually, a few of the fibers in your muscle are torn. Muscle strain is common in athletes. Recovery usually takes 1-2 weeks. Complete healing takes 5-6 weeks.  HOME CARE   Follow the PRICE method of treatment to help your injury get better. Do this the first 2-3 days after the injury:  Protect. Protect the muscle to keep it from getting injured again.  Rest. Limit your activity and rest the injured body part.  Ice. Put ice in a plastic bag. Place a towel between your skin and the bag. Then, apply the ice and leave it on from 15-20 minutes each hour. After the third day, switch to moist heat packs.  Compression. Use a splint or elastic bandage on the injured area for comfort. Do not put it on too tightly.  Elevate. Keep the injured body part above the level of your heart.  Only take medicine as told by your doctor.  Warm up before doing exercise to prevent future muscle strains. GET HELP IF:   You have more pain or puffiness (swelling) in the injured area.  You feel numbness, tingling, or notice a loss of strength in the injured area. MAKE SURE YOU:   Understand these instructions.  Will watch your condition.  Will get help right away if you are not doing well or get worse.   This information is not intended to replace advice given to you by your health care provider. Make sure you discuss any questions you have with your health care provider.   Document Released: 11/26/2007 Document Revised: 12/07/2012 Document Reviewed: 09/15/2012 Elsevier Interactive Patient Education 2016 [employer] Inc. What Do I Need to Know About Injuries During Pregnancy? Injuries can happen during pregnancy. Minor falls and accidents usually do not harm you or your baby. However, any injury should be reported to your doctor. WHAT CAN  I DO TO PROTECT MYSELF FROM INJURIES?  Remove rugs and loose objects on the floor.  Wear comfortable shoes that have a good grip. Do not wear high-heeled shoes.  Always wear your seat belt. The lap belt should be below your belly. Always practice safe driving.  Do not ride on a motorcycle.  Do not participate in high-impact activities or sports.  Avoid:  Walking on wet or slippery floors.  Fires.  Starting fires.  Lifting heavy pots of boiling or hot liquids.  Fixing electrical problems.  Only take medicine as told by your doctor.  Know your blood type and the blood type of the baby's father.  Call your local emergency services (911 in the [place] if you are a victim of domestic violence or assault. For help and support, contact the [employer]. WHEN SHOULD I GET HELP RIGHT AWAY?  You fall on your belly or have any high-impact accident or injury.  You have been a victim of domestic violence or any kind of violence.  You have been in a car accident.  You have bleeding from your vagina.  Fluid is leaking from your vagina.  You start to have belly cramping (contractions) or pain.  You feel weak or pass out (faint).  You start to throw up (vomit) after an injury.  You have been burned.  You have a stiff neck or neck pain.  You get a headache or have vision problems after an injury.  You do  not feel the baby move or the baby is not moving as much as normal.   This information is not intended to replace advice given to you by your health care provider. Make sure you discuss any questions you have with your health care provider.   Document Released: 03/21/2010 Document Revised: 03/09/2014 Document Reviewed: 11/23/2012 Elsevier Interactive Patient Education [employer]
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
## 2015-08-21 DIAGNOSIS — F419 Anxiety disorder, unspecified: Secondary | ICD-10-CM | POA: Diagnosis not present

## 2015-08-21 DIAGNOSIS — Z3A27 27 weeks gestation of pregnancy: Secondary | ICD-10-CM | POA: Diagnosis not present

## 2015-08-21 DIAGNOSIS — O26892 Other specified pregnancy related conditions, second trimester: Secondary | ICD-10-CM | POA: Diagnosis not present

## 2015-08-21 DIAGNOSIS — O368130 Decreased fetal movements, third trimester, not applicable or unspecified: Secondary | ICD-10-CM | POA: Diagnosis not present

## 2015-08-21 DIAGNOSIS — O9A212 Injury, poisoning and certain other consequences of external causes complicating pregnancy, second trimester: Secondary | ICD-10-CM | POA: Diagnosis not present

## 2015-08-21 DIAGNOSIS — R109 Unspecified abdominal pain: Secondary | ICD-10-CM | POA: Diagnosis present

## 2015-08-21 DIAGNOSIS — Z87891 Personal history of nicotine dependence: Secondary | ICD-10-CM | POA: Diagnosis not present

## 2015-08-21 DIAGNOSIS — S199XXA Unspecified injury of neck, initial encounter: Secondary | ICD-10-CM | POA: Diagnosis not present

## 2015-08-21 DIAGNOSIS — Z3A37 37 weeks gestation of pregnancy: Secondary | ICD-10-CM | POA: Diagnosis not present

## 2015-08-21 DIAGNOSIS — O99342 Other mental disorders complicating pregnancy, second trimester: Secondary | ICD-10-CM | POA: Diagnosis not present

## 2015-08-21 DIAGNOSIS — T148 Other injury of unspecified body region: Secondary | ICD-10-CM | POA: Diagnosis not present

## 2015-09-30 ENCOUNTER — Encounter: Payer: Medicaid Other | Attending: Obstetrics and Gynecology | Admitting: Skilled Nursing Facility1

## 2015-09-30 DIAGNOSIS — O24419 Gestational diabetes mellitus in pregnancy, unspecified control: Secondary | ICD-10-CM | POA: Diagnosis present

## 2015-09-30 DIAGNOSIS — Z713 Dietary counseling and surveillance: Secondary | ICD-10-CM | POA: Insufficient documentation

## 2015-09-30 DIAGNOSIS — O24410 Gestational diabetes mellitus in pregnancy, diet controlled: Secondary | ICD-10-CM

## 2015-10-02 ENCOUNTER — Encounter: Payer: Self-pay | Admitting: Skilled Nursing Facility1

## 2015-10-02 NOTE — Progress Notes (Signed)
  Patient was seen on 09/30/2015 for Gestational Diabetes self-management class at the [facility]. The following learning objectives were met by the patient during this course:   States the definition of Gestational Diabetes  States why dietary management is important in controlling blood glucose  Describes the effects each nutrient has on blood glucose levels  Demonstrates ability to create a balanced meal plan  Demonstrates carbohydrate counting   States when to check blood glucose levels involving a total of 4 separate occurences in a day  Demonstrates proper blood glucose monitoring techniques  States the effect of stress and exercise on blood glucose levels  States the importance of limiting caffeine and abstaining from alcohol and smoking  Demonstrates the knowledge the glucometer provided in class may not be covered by their insurance and to call their insurance provider immediately after class to know which glucometer their insurance provider does cover as well as calling their physician the next day for a prescription to the glucometer their insurance does cover (if the one provided is not) as well as the lancets and strips for that meter.  Blood glucose monitor given: accucheck guide Lot # [id number] Exp: 09/16/16 Blood glucose reading: 74  Patient instructed to monitor glucose levels: FBS: 60 - <90 1 hour: <140 2 hour: <120  *Patient received handouts:  Nutrition Diabetes and Pregnancy  Carbohydrate Counting List  Patient will be seen for follow-up as needed.

## 2015-11-16 ENCOUNTER — Inpatient Hospital Stay (HOSPITAL_COMMUNITY): Payer: Medicaid Other | Admitting: Anesthesiology

## 2015-11-16 ENCOUNTER — Inpatient Hospital Stay (HOSPITAL_COMMUNITY)
Admission: AD | Admit: 2015-11-16 | Discharge: 2015-11-18 | DRG: 775 | Disposition: A | Discharge status: Home / Self Care | Payer: Medicaid Other | Source: Ambulatory Visit | Attending: Obstetrics and Gynecology | Admitting: Obstetrics and Gynecology

## 2015-11-16 ENCOUNTER — Encounter (HOSPITAL_COMMUNITY): Payer: Self-pay

## 2015-11-16 DIAGNOSIS — Z833 Family history of diabetes mellitus: Secondary | ICD-10-CM

## 2015-11-16 DIAGNOSIS — O99824 Streptococcus B carrier state complicating childbirth: Secondary | ICD-10-CM | POA: Diagnosis present

## 2015-11-16 DIAGNOSIS — O99214 Obesity complicating childbirth: Secondary | ICD-10-CM | POA: Diagnosis present

## 2015-11-16 DIAGNOSIS — Z8249 Family history of ischemic heart disease and other diseases of the circulatory system: Secondary | ICD-10-CM

## 2015-11-16 DIAGNOSIS — O24420 Gestational diabetes mellitus in childbirth, diet controlled: Secondary | ICD-10-CM | POA: Diagnosis present

## 2015-11-16 DIAGNOSIS — Z6841 Body Mass Index (BMI) 40.0 and over, adult: Secondary | ICD-10-CM

## 2015-11-16 DIAGNOSIS — Z3483 Encounter for supervision of other normal pregnancy, third trimester: Secondary | ICD-10-CM | POA: Diagnosis present

## 2015-11-16 DIAGNOSIS — O4202 Full-term premature rupture of membranes, onset of labor within 24 hours of rupture: Secondary | ICD-10-CM | POA: Diagnosis present

## 2015-11-16 DIAGNOSIS — Z3A39 39 weeks gestation of pregnancy: Secondary | ICD-10-CM

## 2015-11-16 DIAGNOSIS — Z87891 Personal history of nicotine dependence: Secondary | ICD-10-CM

## 2015-11-16 LAB — CBC
HCT: 37.3 % (ref 36.0–46.0)
Hemoglobin: 12.9 g/dL (ref 12.0–15.0)
MCH: 27.4 pg (ref 26.0–34.0)
MCHC: 34.6 g/dL (ref 30.0–36.0)
MCV: 79.2 fL (ref 78.0–100.0)
Platelets: 199 10*3/uL (ref 150–400)
RBC: 4.71 MIL/uL (ref 3.87–5.11)
RDW: 14.5 % (ref 11.5–15.5)
WBC: 14.2 10*3/uL — ABNORMAL HIGH (ref 4.0–10.5)

## 2015-11-16 LAB — RPR: RPR Ser Ql: NONREACTIVE

## 2015-11-16 LAB — TYPE AND SCREEN
ABO/RH(D): O POS
Antibody Screen: NEGATIVE

## 2015-11-16 MED ORDER — ONDANSETRON HCL 4 MG/2ML IJ SOLN: 4.0000 mg | Freq: Four times a day (QID) | INTRAMUSCULAR | Status: DC | PRN

## 2015-11-16 MED ORDER — PHENYLEPHRINE 40 MCG/ML (10ML) SYRINGE FOR IV PUSH (FOR BLOOD PRESSURE SUPPORT): 80.0000 ug | PREFILLED_SYRINGE | INTRAVENOUS | Status: DC | PRN

## 2015-11-16 MED ORDER — DIPHENHYDRAMINE HCL 25 MG PO CAPS: 25.0000 mg | ORAL_CAPSULE | Freq: Four times a day (QID) | ORAL | Status: DC | PRN

## 2015-11-16 MED ORDER — DIPHENHYDRAMINE HCL 50 MG/ML IJ SOLN: 12.5000 mg | INTRAMUSCULAR | Status: DC | PRN

## 2015-11-16 MED ORDER — METHYLERGONOVINE MALEATE 0.2 MG PO TABS
0.2000 mg | ORAL_TABLET | ORAL | Status: DC | PRN
  Administered 2015-11-16: 0.2 mg via ORAL
  Filled 2015-11-16: qty 1

## 2015-11-16 MED ORDER — LACTATED RINGERS IV SOLN
INTRAVENOUS | Status: DC
  Administered 2015-11-16 (×2): via INTRAVENOUS

## 2015-11-16 MED ORDER — ZOLPIDEM TARTRATE 5 MG PO TABS: 5.0000 mg | ORAL_TABLET | Freq: Every evening | ORAL | Status: DC | PRN

## 2015-11-16 MED ORDER — FENTANYL 2.5 MCG/ML BUPIVACAINE 1/10 % EPIDURAL INFUSION (WH - ANES): 14.0000 mL/h | INTRAMUSCULAR | Status: DC | PRN

## 2015-11-16 MED ORDER — DIBUCAINE 1 % RE OINT: 1.0000 "application " | TOPICAL_OINTMENT | RECTAL | Status: DC | PRN

## 2015-11-16 MED ORDER — SOD CITRATE-CITRIC ACID 500-334 MG/5ML PO SOLN: 30.0000 mL | ORAL | Status: DC | PRN

## 2015-11-16 MED ORDER — IBUPROFEN 600 MG PO TABS
600.0000 mg | ORAL_TABLET | Freq: Four times a day (QID) | ORAL | Status: DC
  Administered 2015-11-16 – 2015-11-18 (×9): 600 mg via ORAL
  Filled 2015-11-16 (×9): qty 1

## 2015-11-16 MED ORDER — PENICILLIN G POTASSIUM 5000000 UNITS IJ SOLR
2.5000 10*6.[IU] | INTRAVENOUS | Status: DC
  Administered 2015-11-16: 2.5 10*6.[IU] via INTRAVENOUS
  Filled 2015-11-16 (×3): qty 2.5

## 2015-11-16 MED ORDER — LACTATED RINGERS IV SOLN: 500.0000 mL | Freq: Once | INTRAVENOUS | Status: DC

## 2015-11-16 MED ORDER — ACETAMINOPHEN 325 MG PO TABS: 650.0000 mg | ORAL_TABLET | ORAL | Status: DC | PRN

## 2015-11-16 MED ORDER — WITCH HAZEL-GLYCERIN EX PADS: 1.0000 "application " | MEDICATED_PAD | CUTANEOUS | Status: DC | PRN

## 2015-11-16 MED ORDER — PRENATAL MULTIVITAMIN CH
1.0000 | ORAL_TABLET | Freq: Every day | ORAL | Status: DC
  Administered 2015-11-16 – 2015-11-18 (×3): 1 via ORAL
  Filled 2015-11-16 (×3): qty 1

## 2015-11-16 MED ORDER — ONDANSETRON HCL 4 MG/2ML IJ SOLN: 4.0000 mg | INTRAMUSCULAR | Status: DC | PRN

## 2015-11-16 MED ORDER — ONDANSETRON HCL 4 MG PO TABS: 4.0000 mg | ORAL_TABLET | ORAL | Status: DC | PRN

## 2015-11-16 MED ORDER — OXYTOCIN BOLUS FROM INFUSION
500.0000 mL | Freq: Once | INTRAVENOUS | Status: AC
  Administered 2015-11-16: 500 mL via INTRAVENOUS

## 2015-11-16 MED ORDER — OXYCODONE-ACETAMINOPHEN 5-325 MG PO TABS
2.0000 | ORAL_TABLET | ORAL | Status: DC | PRN
Start: 2015-11-16 — End: 2015-11-16

## 2015-11-16 MED ORDER — SIMETHICONE 80 MG PO CHEW: 80.0000 mg | CHEWABLE_TABLET | ORAL | Status: DC | PRN

## 2015-11-16 MED ORDER — EPHEDRINE 5 MG/ML INJ: 10.0000 mg | INTRAVENOUS | Status: DC | PRN

## 2015-11-16 MED ORDER — OXYCODONE-ACETAMINOPHEN 5-325 MG PO TABS: 1.0000 | ORAL_TABLET | ORAL | Status: DC | PRN

## 2015-11-16 MED ORDER — COCONUT OIL OIL
1.0000 "application " | TOPICAL_OIL | Status: DC | PRN
  Administered 2015-11-16: 1 via TOPICAL
  Filled 2015-11-16: qty 120

## 2015-11-16 MED ORDER — TETANUS-DIPHTH-ACELL PERTUSSIS 5-2.5-18.5 LF-MCG/0.5 IM SUSP: 0.5000 mL | Freq: Once | INTRAMUSCULAR | Status: DC

## 2015-11-16 MED ORDER — PENICILLIN G POTASSIUM 5000000 UNITS IJ SOLR
5.0000 10*6.[IU] | Freq: Once | INTRAVENOUS | Status: AC
  Administered 2015-11-16: 5 10*6.[IU] via INTRAVENOUS
  Filled 2015-11-16: qty 5

## 2015-11-16 MED ORDER — BENZOCAINE-MENTHOL 20-0.5 % EX AERO
1.0000 "application " | INHALATION_SPRAY | CUTANEOUS | Status: DC | PRN
  Administered 2015-11-16 – 2015-11-17 (×2): 1 via TOPICAL
  Filled 2015-11-16 (×2): qty 56

## 2015-11-16 MED ORDER — LIDOCAINE HCL (PF) 1 % IJ SOLN
30.0000 mL | INTRAMUSCULAR | Status: DC | PRN
  Filled 2015-11-16: qty 30

## 2015-11-16 MED ORDER — FLEET ENEMA 7-19 GM/118ML RE ENEM: 1.0000 | ENEMA | RECTAL | Status: DC | PRN

## 2015-11-16 MED ORDER — PHENYLEPHRINE 40 MCG/ML (10ML) SYRINGE FOR IV PUSH (FOR BLOOD PRESSURE SUPPORT)
80.0000 ug | PREFILLED_SYRINGE | INTRAVENOUS | Status: DC | PRN
  Filled 2015-11-16: qty 5

## 2015-11-16 MED ORDER — PHENYLEPHRINE 40 MCG/ML (10ML) SYRINGE FOR IV PUSH (FOR BLOOD PRESSURE SUPPORT)
PREFILLED_SYRINGE | INTRAVENOUS | Status: AC
  Filled 2015-11-16: qty 20

## 2015-11-16 MED ORDER — OXYCODONE HCL 5 MG PO TABS
10.0000 mg | ORAL_TABLET | ORAL | Status: DC | PRN
Start: 2015-11-16 — End: 2015-11-18

## 2015-11-16 MED ORDER — OXYCODONE HCL 5 MG PO TABS
5.0000 mg | ORAL_TABLET | ORAL | Status: DC | PRN
Start: 2015-11-16 — End: 2015-11-18
  Administered 2015-11-16 – 2015-11-18 (×6): 5 mg via ORAL
  Filled 2015-11-16 (×6): qty 1

## 2015-11-16 MED ORDER — LACTATED RINGERS IV SOLN: 500.0000 mL | INTRAVENOUS | Status: DC | PRN

## 2015-11-16 MED ORDER — QUETIAPINE FUMARATE 100 MG PO TABS
100.0000 mg | ORAL_TABLET | Freq: Every day | ORAL | Status: DC
  Filled 2015-11-16 (×3): qty 1

## 2015-11-16 MED ORDER — SENNOSIDES-DOCUSATE SODIUM 8.6-50 MG PO TABS
2.0000 | ORAL_TABLET | ORAL | Status: DC
  Administered 2015-11-17 (×2): 2 via ORAL
  Filled 2015-11-16 (×2): qty 2

## 2015-11-16 MED ORDER — EPHEDRINE 5 MG/ML INJ
10.0000 mg | INTRAVENOUS | Status: DC | PRN
  Filled 2015-11-16: qty 4

## 2015-11-16 MED ORDER — FENTANYL 2.5 MCG/ML BUPIVACAINE 1/10 % EPIDURAL INFUSION (WH - ANES)
14.0000 mL/h | INTRAMUSCULAR | Status: DC | PRN
  Administered 2015-11-16: 14 mL/h via EPIDURAL

## 2015-11-16 MED ORDER — MAGNESIUM HYDROXIDE 400 MG/5ML PO SUSP: 30.0000 mL | ORAL | Status: DC | PRN

## 2015-11-16 MED ORDER — METHYLERGONOVINE MALEATE 0.2 MG/ML IJ SOLN
0.2000 mg | INTRAMUSCULAR | Status: DC | PRN
Start: 2015-11-16 — End: 2015-11-18

## 2015-11-16 MED ORDER — OXYTOCIN 40 UNITS IN LACTATED RINGERS INFUSION - SIMPLE MED
2.5000 [IU]/h | INTRAVENOUS | Status: DC
  Filled 2015-11-16: qty 1000

## 2015-11-16 MED ORDER — LAMOTRIGINE 25 MG PO TABS
25.0000 mg | ORAL_TABLET | Freq: Two times a day (BID) | ORAL | Status: DC
  Filled 2015-11-16 (×7): qty 1

## 2015-11-16 MED ORDER — MEASLES, MUMPS & RUBELLA VAC ~~LOC~~ INJ
0.5000 mL | INJECTION | Freq: Once | SUBCUTANEOUS | Status: DC
  Filled 2015-11-16: qty 0.5

## 2015-11-16 MED ORDER — LIDOCAINE HCL (PF) 1 % IJ SOLN
INTRAMUSCULAR | Status: DC | PRN
  Administered 2015-11-16 (×2): 5 mL

## 2015-11-16 MED ORDER — SERTRALINE HCL 50 MG PO TABS
50.0000 mg | ORAL_TABLET | Freq: Every day | ORAL | Status: DC
  Administered 2015-11-16 – 2015-11-17 (×2): 50 mg via ORAL
  Filled 2015-11-16 (×3): qty 1

## 2015-11-16 MED ORDER — FENTANYL 2.5 MCG/ML BUPIVACAINE 1/10 % EPIDURAL INFUSION (WH - ANES)
INTRAMUSCULAR | Status: AC
  Filled 2015-11-16: qty 125

## 2015-11-16 NOTE — MAU Note (Signed)
2 cm in office today.  No bleeding. No leaking. Contractions every 4 min for last 3 hours.  Baby moving well.

## 2015-11-16 NOTE — Lactation Note (Signed)
This note was copied from a baby's chart. Lactation Consultation Note  Patient Name: Judith Lee Today's Date: 11/16/2015   Attempted to visit with mother, room was dark and family and infant were asleep. Will follow up at a later time.      Maternal Data    Feeding Feeding Type: Breast Fed Length of feed: 8 min  LATCH Score/Interventions Latch: Grasps breast easily, tongue down, lips flanged, rhythmical sucking.  Audible Swallowing: A few with stimulation  Type of Nipple: Everted at rest and after stimulation  Comfort (Breast/Nipple): Soft / non-tender     Hold (Positioning): Assistance needed to correctly position infant at breast and maintain latch. Intervention(s): Breastfeeding basics reviewed (assisted to pull lips open more)  LATCH Score: 8  Lactation Tools Discussed/Used     Consult Status      Judith Lee 11/16/2015, 8:34 PM

## 2015-11-16 NOTE — Anesthesia Pain Management Evaluation Note (Signed)
  CRNA Pain Management Visit Note  Patient: Judith Lee, 20 y.o., female  "Hello I am a member of the anesthesia team at [facility]. We have an anesthesia team available at all times to provide care throughout the hospital, including epidural management and anesthesia for C-section. I don't know your plan for the delivery whether it a natural birth, water birth, IV sedation, nitrous supplementation, doula or epidural, but we want to meet your pain goals."   1.Was your pain managed to your expectations on prior hospitalizations?   Yes   2.What is your expectation for pain management during this hospitalization?     Epidural  3.How can we help you reach that goal? Epidural in situ.  Record the patient's initial score and the patient's pain goal.   Pain: Patient reports epidural worked well for her labor.  Pain Goal: UTA--patient just delivered baby so I did not want to bombard her with questions during this special time.  The [facility] wants you to be able to say your pain was always managed very well.  Devan Babino L 11/16/2015

## 2015-11-16 NOTE — Plan of Care (Signed)
Problem: Safety: Goal: Ability to remain free from injury will improve Outcome: Completed/Met Date Met: 11/16/15 Encouraged pt not to get out of bed until assisted to be sure she could get up safely. Ambulated patient and patient did well. Provided non skid socks for patient to ambulate with.  Problem: Tissue Perfusion: Goal: Risk factors for ineffective tissue perfusion will decrease Outcome: Completed/Met Date Met: 11/16/15 Encouraged frequent ambulation once ambulating comfortably.   Problem: Urinary Elimination: Goal: Ability to reestablish a normal urinary elimination pattern will improve Outcome: Completed/Met Date Met: 11/16/15 Voiding well independently

## 2015-11-16 NOTE — Anesthesia Procedure Notes (Signed)
Epidural Patient location during procedure: OB Start time: 11/16/2015 2:34 AM End time: 11/16/2015 2:40 AM  Staffing Anesthesiologist: Bonita QuinGUIDETTI, Judith Traum S Performed: anesthesiologist   Preanesthetic Checklist Completed: patient identified, site marked, surgical consent, pre-op evaluation, timeout performed, IV checked, risks and benefits discussed and monitors and equipment checked  Epidural Patient position: sitting Prep: site prepped and draped and DuraPrep Patient monitoring: continuous pulse ox and blood pressure Approach: midline Location: L4-L5 Injection technique: LOR air  Needle:  Needle type: Tuohy  Needle gauge: 17 G Needle length: 9 cm and 9 Needle insertion depth: 7.5 cm Catheter type: closed end flexible Catheter size: 19 Gauge Catheter at skin depth: 13 cm Test dose: negative  Assessment Events: blood not aspirated, injection not painful, no injection resistance, negative IV test and no paresthesia

## 2015-11-16 NOTE — Lactation Note (Signed)
This note was copied from a baby's chart. Lactation Consultation Note  Patient Name: Judith Lee Today's Date: 11/16/2015 Reason for consult: Initial assessment   Initial assessment with first time BF mom of 15 hour old infant. Infant with 12 BF for 10-15 minutes, 4 attempts, 2 voids and 2 stools since birth. LATCH Scores 8-10 by bedside RN's. Mom reports her 89 yo did not latch and she pumped for 6 months.   Infant was cueing to feed when I went into the room, mom latched infant to right breast in the football hold. Infant latched easily with flanged lips, rhythmic suckling and intermittent swallows. Mom was able to hand express large gtts of colostrum and did well with positioning infant at the breast. I encouraged her to lean back more and bring infant to the breast vs breast to infant. Mom voiced understanding. Mom has noticed some pinching with feeding, she is aware to relatch as needed.   Enc mom to feed 8-12 x in 24 hours at first feeding cues and to stimulate infant throughout feeding as needed to maintain suckling. Enc mom to hand express prior to feeding and to feed STS.  BF Resources Handout and LC Brochure given, mom informed of IP/OP Services, BF Support Groups and LC phone #. Enc mom to call for feeding assistance as needed.      Maternal Data Formula Feeding for Exclusion: No Has patient been taught Hand Expression?: Yes Does the patient have breastfeeding experience prior to this delivery?: Yes  Feeding Feeding Type: Breast Fed Length of feed: 5 min  LATCH Score/Interventions Latch: Grasps breast easily, tongue down, lips flanged, rhythmical sucking.  Audible Swallowing: Spontaneous and intermittent  Type of Nipple: Everted at rest and after stimulation  Comfort (Breast/Nipple): Filling, red/small blisters or bruises, mild/mod discomfort  Problem noted: Mild/Moderate discomfort Interventions (Mild/moderate discomfort):  (Deepen latch, pillow  support)  Hold (Positioning): Assistance needed to correctly position infant at breast and maintain latch. Intervention(s): Breastfeeding basics reviewed;Support Pillows;Position options;Skin to skin  LATCH Score: 8  Lactation Tools Discussed/Used WIC Program: Yes   Consult Status Consult Status: Follow-up Date: 11/17/15 Follow-up type: In-patient    Silas FloodSharon S Hice 11/16/2015, 10:44 PM

## 2015-11-16 NOTE — H&P (Signed)
Judith Lee is a 20 y.o. female, G2 P1001, EGA 39+ weeks with EDC 9-18 presenting for ctx.  On eval in MAU she had regular ctx and VE 4 cm.  She was admitted, she had SROM at about 0430 with light meconium, has an epidural.  PNC complicated by GDM controlled with diet, she received weekly 17-P 16-36 weeks for h/o preterm delivery  OB History    Gravida Para Term Preterm AB Living   2 1 0 1 0 1   SAB TAB Ectopic Multiple Live Births   0 0 0 0 1    SVD at 34 weeks  Past Medical History:  Diagnosis Date  . Anxiety   . Panic attack    Past Surgical History:  Procedure Laterality Date  . none    . WISDOM TOOTH EXTRACTION     Family History: family history includes Diabetes in her father and mother; Hypertension in her father and mother. Social History:  reports that she has quit smoking. She has never used smokeless tobacco. She reports that she does not drink alcohol or use drugs.     Maternal Diabetes: Yes:  Diabetes Type:  Diet controlled Genetic Screening: Normal Maternal Ultrasounds/Referrals: Normal Fetal Ultrasounds or other Referrals:  None Maternal Substance Abuse:  No Significant Maternal Medications:  None Significant Maternal Lab Results:  Lab values include: Group B Strep positive Other Comments:  None  Review of Systems  Respiratory: Negative.   Cardiovascular: Negative.    Maternal Medical History:  Reason for admission: Contractions.   Contractions: Frequency: regular.   Perceived severity is strong.    Fetal activity: Perceived fetal activity is normal.    Prenatal Complications - Diabetes: Diabetes is managed by diet.      Dilation: 9 Effacement (%): 100 Station: +1 Exam by:: A. Tuttle, RNC Blood pressure 112/76, pulse 92, temperature 98.3 F (36.8 C), temperature source Oral, resp. rate 20, weight 109.8 kg (242 lb), last menstrual period 02/11/2015, SpO2 96 %. Maternal Exam:  Uterine Assessment: Contraction strength is moderate.  Contraction  frequency is regular.   Abdomen: Patient reports no abdominal tenderness. Estimated fetal weight is 8 lbs.   Fetal presentation: vertex  Introitus: Normal vulva. Normal vagina.  Amniotic fluid character: meconium stained.  Pelvis: adequate for delivery.   Cervix: Cervix evaluated by digital exam.     Fetal Exam Fetal Monitor Review: Mode: ultrasound.   Baseline rate: 130-140.  Variability: moderate (6-25 bpm).   Pattern: accelerations present and no decelerations.    Fetal State Assessment: Category I - tracings are normal.     Physical Exam  Vitals reviewed. Constitutional: She appears well-developed and well-nourished.  Respiratory: Effort normal. No respiratory distress.  GI: Soft.    Prenatal labs: ABO, Rh: --/--/O POS (09/16 0125) Antibody: NEG (09/16 0125) Rubella:  Immune RPR:   NR HBsAg:  Neg  HIV:   NR GBS:   Pos  Assessment/Plan: IUP at 39+ weeks in active labor, A1GDM, GBS pos.  She has progressed to 9 cm, has received 2 doses of PCN, anticipate SVD.   Taelor Moncada D 11/16/2015, 7:02 AM

## 2015-11-16 NOTE — Anesthesia Preprocedure Evaluation (Signed)
Anesthesia Evaluation  Patient identified by MRN, date of birth, ID band Patient awake    Reviewed: Allergy & Precautions, H&P , Patient's Chart, lab work & pertinent test results  Airway Mallampati: III  TM Distance: >3 FB Neck ROM: full    Dental no notable dental hx.    Pulmonary neg pulmonary ROS, former smoker,    Pulmonary exam normal breath sounds clear to auscultation       Cardiovascular negative cardio ROS   Rhythm:regular Rate:Normal     Neuro/Psych PSYCHIATRIC DISORDERS Anxiety negative neurological ROS  negative psych ROS   GI/Hepatic negative GI ROS, Neg liver ROS,   Endo/Other  negative endocrine ROSMorbid obesity  Renal/GU negative Renal ROS     Musculoskeletal   Abdominal   Peds  Hematology negative hematology ROS (+)   Anesthesia Other Findings Panic attacks  Reproductive/Obstetrics (+) Pregnancy                             Anesthesia Physical  Anesthesia Plan  ASA: II  Anesthesia Plan: Epidural   Post-op Pain Management:    Induction:   Airway Management Planned:   Additional Equipment:   Intra-op Plan:   Post-operative Plan:   Informed Consent: I have reviewed the patients History and Physical, chart, labs and discussed the procedure including the risks, benefits and alternatives for the proposed anesthesia with the patient or authorized representative who has indicated his/her understanding and acceptance.     Plan Discussed with:   Anesthesia Plan Comments:         Anesthesia Quick Evaluation

## 2015-11-16 NOTE — Anesthesia Postprocedure Evaluation (Signed)
Anesthesia Post Note  Patient: Judith Lee  Procedure(s) Performed: * No procedures listed *  Patient location during evaluation: Mother Baby Anesthesia Type: Epidural Level of consciousness: awake, awake and alert, oriented and patient cooperative Pain management: pain level controlled Vital Signs Assessment: post-procedure vital signs reviewed and stable Respiratory status: spontaneous breathing, nonlabored ventilation and respiratory function stable Cardiovascular status: stable Postop Assessment: patient able to bend at knees, no headache, no backache and no signs of nausea or vomiting Anesthetic complications: no     Last Vitals:  Vitals:   11/16/15 0802 11/16/15 0818  BP: 124/62 (!) 109/49  Pulse: 90 85  Resp: 16 16  Temp:      Last Pain:  Vitals:   11/16/15 0747  TempSrc:   PainSc: 0-No pain   Pain Goal: Patients Stated Pain Goal: 0 (11/16/15 0049)               [name] L

## 2015-11-16 NOTE — Progress Notes (Signed)
Bleeding noted with fundal check at shift assessment. Quarter-size clot also passed. After patient voided, small amount of bleeding still noted. 2 mg methergine (PRN) given

## 2015-11-17 NOTE — Progress Notes (Signed)
Patient passed 1 1/2 dollar sized clot @ 0040; since then, bleeding has decreased consistently. Voiding every 1.0-2 hours. Last fundal check produced no new bleeding or clots.

## 2015-11-17 NOTE — Plan of Care (Signed)
Problem: Education: Goal: Knowledge of condition will improve Elevated blood pressure. Monitoring through the night to determine if any interventions need to be taken.

## 2015-11-17 NOTE — Progress Notes (Signed)
PPD #1 No problems Afeb, VSS, recent BP 140-150/90s Fundus firm, NT at U-1 Continue routine postpartum care, will keep here today and monitor BP

## 2015-11-17 NOTE — Progress Notes (Signed)
Notified Dr. Jackelyn KnifeMeisinger face to face of patient's elevated blood pressures through the night. Doctor gave no new orders; however, stated that we would keep patient tonight to monitor. Judith Lee, Judith HedgesStefanie BigfootHudspeth

## 2015-11-17 NOTE — Progress Notes (Signed)
CSW met with pt to discuss c/s for her hx of anxiety/panic attacks.  Per mom, she was "having some problems" during her pregnancy and was prescribed Zoloft.  Mom states that she plans on continuing this medication, post-d/c, as it has been "working for her."  Mom denies participation in therapy at this time, but did reach out to an employee of the [facility] program for PPD help following her last pregnancy.  Mom is confident that she will be able to recognize signs/symtoms of PPD and f/u with her MD accordingly.  No other social work needs identified.  CSW signing off, please re-consult, as necessary.  Creta Levin, LCSW Weekend Coverage [phone]

## 2015-11-18 MED ORDER — IBUPROFEN 600 MG PO TABS: 600.0000 mg | ORAL_TABLET | Freq: Four times a day (QID) | ORAL | 1 refills | Status: DC | PRN

## 2015-11-18 MED ORDER — OXYCODONE HCL 5 MG PO TABS: 5.0000 mg | ORAL_TABLET | ORAL | 0 refills | Status: DC | PRN

## 2015-11-18 NOTE — Discharge Summary (Signed)
OB Discharge Summary     Patient Name: Judith PeersVanessa Lipschutz DOB: Dec 11, 1995 MRN: [id number]  Date of admission: 11/16/2015 Delivering MD: Jackelyn KnifeMEISINGER, TODD   Date of discharge: 11/18/2015  Admitting diagnosis: 39 PLUS 4 DAYS CONTRACTIONS ABOUT 5 MINUTES APART Intrauterine pregnancy: 7083w5d     Secondary diagnosis:  Active Problems:   Normal labor  Additional problems: labile BP     Discharge diagnosis: Term Pregnancy Delivered                                                                                                Post partum procedures:none  Augmentation: Pitocin  Complications: None  Hospital course:  Onset of Labor With Vaginal Delivery     20 y.o. yo [id number] at 4183w5d was admitted in Active Labor on 11/16/2015. Patient had an uncomplicated labor course as follows:  Membrane Rupture Time/Date: 4:25 AM ,11/16/2015   Intrapartum Procedures: Episiotomy: None [1]                                         Lacerations:  None [1]  Patient had a delivery of a Viable infant. 11/16/2015  Information for the patient's newborn:  [place], Girl Erie NoeVanessa [[id number]][[id number]]  Delivery Method: Vaginal, Spontaneous Delivery (Filed from Delivery Summary)    Pateint had an uncomplicated postpartum course.  She is ambulating, tolerating a regular diet, passing flatus, and urinating well. Patient is discharged home in stable condition on 11/18/15.    Physical exam Vitals:   11/17/15 0834 11/17/15 1900 11/18/15 0500 11/18/15 1230  BP: (!) 143/95 119/71 133/75 130/76  Pulse: 89 88 86 80  Resp:  18 18   Temp:  98 F (36.7 C) 98.7 F (37.1 C)   TempSrc:  Oral Oral   SpO2:  99%  100%  Weight:       General: alert, cooperative and no distress Lochia: appropriate Uterine Fundus: firm Incision: N/A DVT Evaluation: No evidence of DVT seen on physical exam. Labs: Lab Results  Component Value Date   WBC 14.2 (H) 11/16/2015   HGB 12.9 11/16/2015   HCT 37.3 11/16/2015   MCV 79.2 11/16/2015   PLT 199  11/16/2015   CMP Latest Ref Rng & Units 06/27/2014  Glucose 70 - 99 mg/dL 952(W112(H)  BUN 6 - 23 mg/dL 12  Creatinine 4.130.50 - 2.441.10 mg/dL 0.100.65  Sodium 272135 - 536145 mmol/L 136  Potassium 3.5 - 5.1 mmol/L 3.8  Chloride 96 - 112 mmol/L 109  CO2 19 - 32 mmol/L 21  Calcium 8.4 - 10.5 mg/dL 9.0  Total Protein 6.0 - 8.3 g/dL -  Total Bilirubin 0.3 - 1.2 mg/dL -  Alkaline Phos 39 - 644117 U/L -  AST 0 - 37 U/L -  ALT 0 - 35 U/L -    Discharge instruction: per After Visit Summary and "Baby and Me Booklet".    Diet: routine diet  Activity: Advance as tolerated. Pelvic rest for 6 weeks.   Outpatient follow up:6 weeks Follow up Appt:No future  appointments. Follow up Visit:No Follow-up on file.  Postpartum contraception: IUD Mirena  Newborn Data: Live born female  Birth Weight: 7 lb 11.1 oz (3490 g) APGAR: 7, 9  Baby Feeding: Breast Disposition:home with mother   11/18/2015 Edwinna Areola, DO

## 2015-11-18 NOTE — Progress Notes (Signed)
Patient ID: Frederick PeersVanessa Knoedler, female   DOB: [birth year], 20 y.o.   MRN: [id number]   Pt had BP of 152-171/104-110 this am   Repeat now in normal range  Unsure if correct cuff was used this am  Pt denies HA or blurry vision  Will repeat Bp in an hour.  If nl will plan on d/c to home with repeat bp later this week

## 2015-11-18 NOTE — Progress Notes (Signed)
UR chart review completed.  

## 2015-11-18 NOTE — Discharge Instructions (Signed)
Nothing in vagina for 6 weeks.  No sex, tampons, and douching.  Other instructions as in [facility] Discharge Booklet. °

## 2015-11-18 NOTE — Lactation Note (Signed)
This note was copied from a baby's chart. Lactation Consultation Note  Patient Name: Judith Lee Today's Date: 11/18/2015  Mom states baby is latching well and she is working on deep latch.  Nipples slightly reddened but intact.  Mom using coconut oil after feedings.  Discussed milk coming to volume and engorgement treatment.  Instructed to keep a feeding diary the first week home.  Manual pump given with instructions.  Lactation outpatient services and support reviewed and encouraged prn.   Maternal Data    Feeding    LATCH Score/Interventions                      Lactation Tools Discussed/Used     Consult Status      Huston FoleyMOULDEN, [name] 11/18/2015, 9:30 AM

## 2020-03-22 DIAGNOSIS — Z20822 Contact with and (suspected) exposure to covid-19: Secondary | ICD-10-CM | POA: Diagnosis not present

## 2020-10-18 ENCOUNTER — Other Ambulatory Visit: Payer: Self-pay

## 2020-10-18 ENCOUNTER — Other Ambulatory Visit (HOSPITAL_COMMUNITY)
Admission: RE | Admit: 2020-10-18 | Discharge: 2020-10-18 | Disposition: A | Discharge status: Home / Self Care | Payer: Medicaid Other | Source: Ambulatory Visit | Attending: Obstetrics | Admitting: Obstetrics

## 2020-10-18 ENCOUNTER — Ambulatory Visit (INDEPENDENT_AMBULATORY_CARE_PROVIDER_SITE_OTHER): Payer: Medicaid Other | Admitting: Obstetrics

## 2020-10-18 ENCOUNTER — Encounter: Payer: Self-pay | Admitting: Obstetrics

## 2020-10-18 VITALS — BP 137/87 | HR 76 | Ht 63.0 in | Wt 219.4 lb

## 2020-10-18 DIAGNOSIS — Z01419 Encounter for gynecological examination (general) (routine) without abnormal findings: Secondary | ICD-10-CM

## 2020-10-18 DIAGNOSIS — Z113 Encounter for screening for infections with a predominantly sexual mode of transmission: Secondary | ICD-10-CM

## 2020-10-18 DIAGNOSIS — N898 Other specified noninflammatory disorders of vagina: Secondary | ICD-10-CM | POA: Diagnosis not present

## 2020-10-18 DIAGNOSIS — Z975 Presence of (intrauterine) contraceptive device: Secondary | ICD-10-CM

## 2020-10-18 NOTE — Progress Notes (Signed)
Subjective:        Judith Lee is a 25 y.o. female here for a routine exam.  Current complaints: Vaginal discharge.    Personal health questionnaire:  Is patient Ashkenazi Jewish, have a family history of breast and/or ovarian cancer: no Is there a family history of uterine cancer diagnosed at age < 65, gastrointestinal cancer, urinary tract cancer, family member who is a Personnel officer syndrome-associated carrier: no Is the patient overweight and hypertensive, family history of diabetes, personal history of gestational diabetes, preeclampsia or PCOS: no Is patient over 36, have PCOS,  family history of premature CHD under age 8, diabetes, smoke, have hypertension or peripheral artery disease:  no At any time, has a partner hit, kicked or otherwise hurt or frightened you?: no Over the past 2 weeks, have you felt down, depressed or hopeless?: no Over the past 2 weeks, have you felt little interest or pleasure in doing things?:no   Gynecologic History No LMP recorded. Contraception: IUD Last Pap: 09-09-2012. Results were: normal Last mammogram: n/a. Results were: n/a  Obstetric History OB History  Gravida Para Term Preterm AB Living  2 2 1 1  0 2  SAB IAB Ectopic Multiple Live Births  0 0 0 0 2    # Outcome Date GA Lbr Len/2nd Weight Sex Delivery Anes PTL Lv  2 Term 11/16/15 [redacted]w[redacted]d 13:41 / 00:05 7 lb 11.1 oz (3.49 kg) F Vag-Spont EPI  LIV  1 Preterm 09/08/12 [redacted]w[redacted]d 07:48 / 00:41  M Vag-Spont EPI  LIV    Past Medical History:  Diagnosis Date   Anxiety    Panic attack     Past Surgical History:  Procedure Laterality Date   none     WISDOM TOOTH EXTRACTION       Current Outpatient Medications:    ibuprofen (ADVIL,MOTRIN) 600 MG tablet, Take 1 tablet (600 mg total) by mouth every 6 (six) hours as needed., Disp: 40 tablet, Rfl: 1   oxyCODONE (OXY IR/ROXICODONE) 5 MG immediate release tablet, Take 1 tablet (5 mg total) by mouth every 4 (four) hours as needed for severe pain (pain  scale 4-7)., Disp: 10 tablet, Rfl: 0   sertraline (ZOLOFT) 50 MG tablet, Take 50 mg by mouth daily., Disp: , Rfl:  No Known Allergies  Social History   Tobacco Use   Smoking status: Former   Smokeless tobacco: Never  Substance Use Topics   Alcohol use: No    Family History  Problem Relation Age of Onset   Diabetes Mother    Hypertension Mother    Diabetes Father    Hypertension Father       Review of Systems  Constitutional: negative for fatigue and weight loss Respiratory: negative for cough and wheezing Cardiovascular: negative for chest pain, fatigue and palpitations Gastrointestinal: negative for abdominal pain and change in bowel habits Musculoskeletal:negative for myalgias Neurological: negative for gait problems and tremors Behavioral/Psych: negative for abusive relationship, depression Endocrine: negative for temperature intolerance    Genitourinary: positive for vaginal discharge.  negative for abnormal menstrual periods, genital lesions, hot flashes, sexual problems  Integument/breast: negative for breast lump, breast tenderness, nipple discharge and skin lesion(s)    Objective:       Wt 219 lb 6.4 oz (99.5 kg)   BMI 38.86 kg/m  General:   Alert and no distress  Skin:   no rash or abnormalities  Lungs:   clear to auscultation bilaterally  Heart:   regular rate and rhythm, S1, S2 normal,  no murmur, click, rub or gallop  Breasts:   normal without suspicious masses, skin or nipple changes or axillary nodes  Abdomen:  normal findings: no organomegaly, soft, non-tender and no hernia  Pelvis:  External genitalia: normal general appearance Urinary system: urethral meatus normal and bladder without fullness, nontender Vaginal: normal without tenderness, induration or masses Cervix: normal appearance Adnexa: normal bimanual exam Uterus: anteverted and non-tender, normal size   Lab Review Urine pregnancy test Labs reviewed yes Radiologic studies reviewed  no  I have spent a total of 20 minutes of face-to-face time, excluding clinical staff time, reviewing notes and preparing to see patient, ordering tests and/or medications, and counseling the patient.   Assessment:     1. Encounter for gynecological examination with Papanicolaou smear of cervix Rx: - Cytology - PAP( [facility])  2. Vaginal discharge Rx: - Cervicovaginal ancillary only( )  3. Screening for STD (sexually transmitted disease) Rx: - Hepatitis B surface antigen - Hepatitis C antibody - HIV Antibody (routine testing w rflx) - RPR  4. IUD (intrauterine device) in place    Plan:    Education reviewed: calcium supplements, depression evaluation, low fat, low cholesterol diet, safe sex/STD prevention, self breast exams, and weight bearing exercise. Contraception: IUD. Follow up in: 1 year.     Brock Bad, MD 10/18/2020 8:29 AM

## 2020-10-18 NOTE — Progress Notes (Signed)
NGYN presents for annual, pap, and STD's.  Mirena IUD expires next month per pt and she would like another one.

## 2020-10-19 LAB — HEPATITIS C ANTIBODY: Hep C Virus Ab: <0.1 s/co ratio (ref 0.0–0.9)

## 2020-10-19 LAB — HEPATITIS B SURFACE ANTIGEN: Hepatitis B Surface Ag: NEGATIVE

## 2020-10-19 LAB — HIV ANTIBODY (ROUTINE TESTING W REFLEX): HIV Screen 4th Generation wRfx: NONREACTIVE

## 2020-10-19 LAB — RPR: RPR Ser Ql: NONREACTIVE

## 2020-10-21 LAB — CERVICOVAGINAL ANCILLARY ONLY
Bacterial Vaginitis (gardnerella): POSITIVE — AB
Candida Glabrata: NEGATIVE
Candida Vaginitis: POSITIVE — AB
Chlamydia: NEGATIVE
Comment: NEGATIVE
Comment: NEGATIVE
Comment: NEGATIVE
Comment: NEGATIVE
Comment: NEGATIVE
Comment: NORMAL
Neisseria Gonorrhea: NEGATIVE
Trichomonas: POSITIVE — AB

## 2020-10-22 LAB — CYTOLOGY - PAP
Diagnosis: NEGATIVE
Diagnosis: REACTIVE

## 2020-10-23 ENCOUNTER — Other Ambulatory Visit: Payer: Self-pay | Admitting: Obstetrics

## 2020-10-23 DIAGNOSIS — B3731 Acute candidiasis of vulva and vagina: Secondary | ICD-10-CM

## 2020-10-23 DIAGNOSIS — N760 Acute vaginitis: Secondary | ICD-10-CM

## 2020-10-23 DIAGNOSIS — B373 Candidiasis of vulva and vagina: Secondary | ICD-10-CM

## 2020-10-23 DIAGNOSIS — B9689 Other specified bacterial agents as the cause of diseases classified elsewhere: Secondary | ICD-10-CM

## 2020-10-23 MED ORDER — FLUCONAZOLE 150 MG PO TABS
150.0000 mg | ORAL_TABLET | Freq: Once | ORAL | 0 refills | Status: AC
Start: 2020-10-23 — End: 2020-10-23

## 2020-10-23 MED ORDER — METRONIDAZOLE 500 MG PO TABS: 500.0000 mg | ORAL_TABLET | Freq: Two times a day (BID) | ORAL | 2 refills | Status: DC

## 2020-11-14 ENCOUNTER — Other Ambulatory Visit: Payer: Self-pay

## 2020-11-14 ENCOUNTER — Encounter: Payer: Self-pay | Admitting: Emergency Medicine

## 2020-11-14 ENCOUNTER — Ambulatory Visit
Admission: EM | Admit: 2020-11-14 | Discharge: 2020-11-14 | Disposition: A | Discharge status: Home / Self Care | Payer: Medicaid Other | Attending: Emergency Medicine | Admitting: Emergency Medicine

## 2020-11-14 DIAGNOSIS — J029 Acute pharyngitis, unspecified: Secondary | ICD-10-CM | POA: Insufficient documentation

## 2020-11-14 DIAGNOSIS — J039 Acute tonsillitis, unspecified: Secondary | ICD-10-CM | POA: Diagnosis not present

## 2020-11-14 LAB — POCT RAPID STREP A (OFFICE): Rapid Strep A Screen: NEGATIVE

## 2020-11-14 MED ORDER — LIDOCAINE VISCOUS HCL 2 % MT SOLN: 15.0000 mL | OROMUCOSAL | 0 refills | Status: DC | PRN

## 2020-11-14 NOTE — ED Provider Notes (Signed)
[facility]   [id number] 11/14/20 Arrival Time: 1655  ID:POEU THROAT  SUBJECTIVE: History from: patient.  Judith Lee is a 25 y.o. female who presents with abrupt onset of sore throat that began this morning.   Admits to oral sex 4 days ago.  Would like to be tested for oral STIs as well.  Partner without symptoms.  Has NOT tried OTC medications without relief.  Symptoms are made worse with swallowing, but tolerating liquids and own secretions without difficulty.  Reports previous symptoms in the past with strep.   Denies fever, chills, fatigue, ear pain, sinus pain, rhinorrhea, nasal congestion, cough, SOB, wheezing, chest pain, nausea, rash, changes in bowel or bladder habits.   Recently treated with metronidazole for trich, but that was last week.     ROS: As per HPI.  All other pertinent ROS negative.     Past Medical History:  Diagnosis Date   Anxiety    Panic attack    Past Surgical History:  Procedure Laterality Date   none     WISDOM TOOTH EXTRACTION     No Known Allergies No current facility-administered medications on file prior to encounter.   Current Outpatient Medications on File Prior to Encounter  Medication Sig Dispense Refill   ibuprofen (ADVIL,MOTRIN) 600 MG tablet Take 1 tablet (600 mg total) by mouth every 6 (six) hours as needed. 40 tablet 1   metroNIDAZOLE (FLAGYL) 500 MG tablet Take 1 tablet (500 mg total) by mouth 2 (two) times daily. 14 tablet 2   oxyCODONE (OXY IR/ROXICODONE) 5 MG immediate release tablet Take 1 tablet (5 mg total) by mouth every 4 (four) hours as needed for severe pain (pain scale 4-7). 10 tablet 0   sertraline (ZOLOFT) 50 MG tablet Take 50 mg by mouth daily.     Social History   Socioeconomic History   Marital status: Married    Spouse name: Not on file   Number of children: Not on file   Years of education: Not on file   Highest education level: Not on file  Occupational History   Not on file  Tobacco Use    Smoking status: Former   Smokeless tobacco: Never  Substance and Sexual Activity   Alcohol use: No   Drug use: No    Types: Marijuana   Sexual activity: Yes    Birth control/protection: I.U.D.  Other Topics Concern   Not on file  Social History Narrative   Not on file   Social Determinants of Health   Financial Resource Strain: Not on file  Food Insecurity: Not on file  Transportation Needs: Not on file  Physical Activity: Not on file  Stress: Not on file  Social Connections: Not on file  Intimate Partner Violence: Not on file   Family History  Problem Relation Age of Onset   Diabetes Mother    Hypertension Mother    Diabetes Father    Hypertension Father     OBJECTIVE:  Vitals:   11/14/20 1718  BP: (!) 149/75  Pulse: 89  Resp: 17  Temp: 97.9 F (36.6 C)  TempSrc: Tympanic  SpO2: 97%    General appearance: alert; well-appearing, nontoxic; speaking in full sentences and tolerating own secretions HEENT: NCAT; Ears: EACs clear, TMs pearly gray; Eyes: PERRL.  EOM grossly intact.Nose: nares patent without rhinorrhea, Throat: oropharynx clear, tonsils erythematous and 2+, with white exudates, uvula midline  Neck: supple without LAD Lungs: unlabored respirations, symmetrical air entry; cough: absent; no respiratory distress;  CTAB Heart: regular rate and rhythm.  Skin: warm and dry Psychological: alert and cooperative; normal mood and affect   LABS:   Results for orders placed or performed during the hospital encounter of 11/14/20 (from the past 24 hour(s))  POCT rapid strep A     Status: None   Collection Time: 11/14/20  5:27 PM  Result Value Ref Range   Rapid Strep A Screen Negative Negative     ASSESSMENT & PLAN:  1. Sore throat     Meds ordered this encounter  Medications   lidocaine (XYLOCAINE) 2 % solution    Sig: Use as directed 15 mLs in the mouth or throat as needed for mouth pain (Do NOT exceed 8 doses in a 24 hour period).    Dispense:  100 mL     Refill:  0    Order Specific Question:   Supervising Provider    Answer:   Eustace Moore [[id number]]   Oral cytology swab obtained Covid swab obtained Strep test negative, will send out for culture  We will call you with abnormal test results Get plenty of rest and push fluids Viscous lidocaine prescribed.  This is an oral solution you can swish, and gargle as needed for symptomatic relief of sore throat.  Do not exceed 8 doses in a 24 hour period.  Do not use prior to eating, as this will numb your entire mouth.   Drink warm or cool liquids, use throat lozenges, or popsicles to help alleviate symptoms Take OTC ibuprofen or tylenol as needed for pain Follow up with PCP if symptoms persists Return or go to ER if patient has any new or worsening symptoms such as fever, chills, nausea, vomiting, worsening sore throat, cough, abdominal pain, chest pain, changes in bowel or bladder habits, etc...  Reviewed expectations re: course of current medical issues. Questions answered. Outlined signs and symptoms indicating need for more acute intervention. Patient verbalized understanding. After Visit Summary given.         Rennis Harding, PA-C 11/14/20 1729

## 2020-11-14 NOTE — Discharge Instructions (Addendum)
Oral cytology swab obtained Covid swab obtained Strep test negative, will send out for culture  We will call you with abnormal test results Get plenty of rest and push fluids Viscous lidocaine prescribed.  This is an oral solution you can swish, and gargle as needed for symptomatic relief of sore throat.  Do not exceed 8 doses in a 24 hour period.  Do not use prior to eating, as this will numb your entire mouth.   Drink warm or cool liquids, use throat lozenges, or popsicles to help alleviate symptoms Take OTC ibuprofen or tylenol as needed for pain Follow up with PCP if symptoms persists Return or go to ER if patient has any new or worsening symptoms such as fever, chills, nausea, vomiting, worsening sore throat, cough, abdominal pain, chest pain, changes in bowel or bladder habits, etc..Marland Kitchen

## 2020-11-14 NOTE — ED Triage Notes (Signed)
Triaged by provider  

## 2020-11-15 ENCOUNTER — Emergency Department (HOSPITAL_COMMUNITY)
Admission: EM | Admit: 2020-11-15 | Discharge: 2020-11-15 | Disposition: A | Discharge status: Home / Self Care | Payer: Medicaid Other | Attending: Emergency Medicine | Admitting: Emergency Medicine

## 2020-11-15 ENCOUNTER — Encounter (HOSPITAL_COMMUNITY): Payer: Self-pay | Admitting: *Deleted

## 2020-11-15 ENCOUNTER — Other Ambulatory Visit: Payer: Self-pay

## 2020-11-15 DIAGNOSIS — Z87891 Personal history of nicotine dependence: Secondary | ICD-10-CM | POA: Insufficient documentation

## 2020-11-15 DIAGNOSIS — J039 Acute tonsillitis, unspecified: Secondary | ICD-10-CM | POA: Diagnosis not present

## 2020-11-15 DIAGNOSIS — J029 Acute pharyngitis, unspecified: Secondary | ICD-10-CM | POA: Diagnosis present

## 2020-11-15 LAB — COMPREHENSIVE METABOLIC PANEL
ALT: 18 U/L (ref 0–44)
AST: 16 U/L (ref 15–41)
Albumin: 4 g/dL (ref 3.5–5.0)
Alkaline Phosphatase: 49 U/L (ref 38–126)
Anion gap: 8 (ref 5–15)
BUN: 7 mg/dL (ref 6–20)
CO2: 21 mmol/L — ABNORMAL LOW (ref 22–32)
Calcium: 8.8 mg/dL — ABNORMAL LOW (ref 8.9–10.3)
Chloride: 106 mmol/L (ref 98–111)
Creatinine, Ser: 0.58 mg/dL (ref 0.44–1.00)
GFR, Estimated: >60 mL/min (ref >60)
Glucose, Bld: 100 mg/dL — ABNORMAL HIGH (ref 70–99)
Potassium: 3.5 mmol/L (ref 3.5–5.1)
Sodium: 135 mmol/L (ref 135–145)
Total Bilirubin: 1 mg/dL (ref 0.3–1.2)
Total Protein: 7.4 g/dL (ref 6.5–8.1)

## 2020-11-15 LAB — CBC WITH DIFFERENTIAL/PLATELET
Abs Immature Granulocytes: 0.25 10*3/uL — ABNORMAL HIGH (ref 0.00–0.07)
Basophils Absolute: 0.1 10*3/uL (ref 0.0–0.1)
Basophils Relative: 0 %
Eosinophils Absolute: 0.1 10*3/uL (ref 0.0–0.5)
Eosinophils Relative: 0 %
HCT: 41.8 % (ref 36.0–46.0)
Hemoglobin: 14.1 g/dL (ref 12.0–15.0)
Immature Granulocytes: 1 %
Lymphocytes Relative: 7 %
Lymphs Abs: 1.7 10*3/uL (ref 0.7–4.0)
MCH: 29 pg (ref 26.0–34.0)
MCHC: 33.7 g/dL (ref 30.0–36.0)
MCV: 85.8 fL (ref 80.0–100.0)
Monocytes Absolute: 2 10*3/uL — ABNORMAL HIGH (ref 0.1–1.0)
Monocytes Relative: 8 %
Neutro Abs: 19.6 10*3/uL — ABNORMAL HIGH (ref 1.7–7.7)
Neutrophils Relative %: 84 %
Platelets: 280 10*3/uL (ref 150–400)
RBC: 4.87 MIL/uL (ref 3.87–5.11)
RDW: 14.1 % (ref 11.5–15.5)
WBC: 23.6 10*3/uL — ABNORMAL HIGH (ref 4.0–10.5)
nRBC: 0 % (ref 0.0–0.2)

## 2020-11-15 LAB — NOVEL CORONAVIRUS, NAA: SARS-CoV-2, NAA: NOT DETECTED

## 2020-11-15 LAB — SARS-COV-2, NAA 2 DAY TAT

## 2020-11-15 LAB — MONONUCLEOSIS SCREEN: Mono Screen: NEGATIVE

## 2020-11-15 MED ORDER — CEPHALEXIN 500 MG PO CAPS: 500.0000 mg | ORAL_CAPSULE | Freq: Two times a day (BID) | ORAL | 0 refills | Status: DC

## 2020-11-15 MED ORDER — METHYLPREDNISOLONE SODIUM SUCC 125 MG IJ SOLR
125.0000 mg | Freq: Once | INTRAMUSCULAR | Status: AC
  Administered 2020-11-15: 125 mg via INTRAVENOUS
  Filled 2020-11-15: qty 2

## 2020-11-15 MED ORDER — SODIUM CHLORIDE 0.9 % IV BOLUS
1000.0000 mL | Freq: Once | INTRAVENOUS | Status: AC
  Administered 2020-11-15: 1000 mL via INTRAVENOUS

## 2020-11-15 NOTE — Discharge Instructions (Addendum)
Your symptoms are likely due to tonsillitis, or infection of your tonsils. This is likely due to bacteria. Your mononucleosis viral test was negative.   I am writing you a prescription for antibiotic to take twice daily for 10 days. It is important you finish the entire course, even if you are feeling better.   You can continue taking over the counter medications for fever and pain. As well as using the lidocaine mouthwash. Continue to monitor your symptoms and return to ED for new or worsening difficulty swallowing or breathing.

## 2020-11-15 NOTE — ED Triage Notes (Signed)
Pt c/o sore throat that started yesterday; pt states she is having trouble swallowing today; pt was seen at urgent care yesterday and told if it gets worse to go to ED; pt c/o sore body and muscle aches

## 2020-11-15 NOTE — ED Provider Notes (Signed)
[facility] EMERGENCY DEPARTMENT Provider Note   CSN: [id number] Arrival date & time: 11/15/20  0934     History Chief Complaint  Patient presents with   Sore Throat    Judith Lee is a 25 y.o. female who presents with sore throat, difficulty swallowing, body aches, and headache that started yesterday. Felt feverish but has not taken temperature. Has tried over the counter medications without relief. Pt seen at urgent care yesterday, strep test negative, culture and COVID test pending. Was given lidocaine mouthwash without relief. No CP or SOB.    Sore Throat Pertinent negatives include no chest pain, no abdominal pain and no shortness of breath.      Past Medical History:  Diagnosis Date   Anxiety    Panic attack     Patient Active Problem List   Diagnosis Date Noted   Normal labor 11/16/2015   Preterm premature rupture of membranes 09/10/2012    Past Surgical History:  Procedure Laterality Date   none     WISDOM TOOTH EXTRACTION       OB History     Gravida  2   Para  2   Term  1   Preterm  1   AB  0   Living  2      SAB  0   IAB  0   Ectopic  0   Multiple  0   Live Births  2           Family History  Problem Relation Age of Onset   Diabetes Mother    Hypertension Mother    Diabetes Father    Hypertension Father     Social History   Tobacco Use   Smoking status: Former   Smokeless tobacco: Never  Substance Use Topics   Alcohol use: No   Drug use: No    Types: Marijuana    Home Medications Prior to Admission medications   Medication Sig Start Date End Date Taking? Authorizing Provider  cephALEXin (KEFLEX) 500 MG capsule Take 1 capsule (500 mg total) by mouth 2 (two) times daily. 11/15/20  Yes Brennden Masten T, PA-C  ibuprofen (ADVIL,MOTRIN) 600 MG tablet Take 1 tablet (600 mg total) by mouth every 6 (six) hours as needed. Patient not taking: Reported on 11/15/2020 11/18/15   Edwinna Areola, DO  lidocaine  (XYLOCAINE) 2 % solution Use as directed 15 mLs in the mouth or throat as needed for mouth pain (Do NOT exceed 8 doses in a 24 hour period). Patient not taking: Reported on 11/15/2020 11/14/20   Wurst, [place], PA-C  metroNIDAZOLE (FLAGYL) 500 MG tablet Take 1 tablet (500 mg total) by mouth 2 (two) times daily. Patient not taking: Reported on 11/15/2020 10/23/20   Brock Bad, MD  oxyCODONE (OXY IR/ROXICODONE) 5 MG immediate release tablet Take 1 tablet (5 mg total) by mouth every 4 (four) hours as needed for severe pain (pain scale 4-7). Patient not taking: Reported on 11/15/2020 11/18/15   Edwinna Areola, DO  sertraline (ZOLOFT) 50 MG tablet Take 50 mg by mouth daily. Patient not taking: Reported on 11/15/2020    [provider]    Allergies    Patient has no known allergies.  Review of Systems   Review of Systems  Constitutional:  Positive for chills and fever.  HENT:  Positive for congestion, sore throat and trouble swallowing.   Respiratory:  Negative for shortness of breath.   Cardiovascular:  Negative for chest pain.  Gastrointestinal:  Positive for nausea. Negative for abdominal pain and vomiting.  All other systems reviewed and are negative.  Physical Exam Updated Vital Signs BP 133/85   Pulse 99   Temp 98.8 F (37.1 C) (Oral)   Resp 16   Ht 5\' 3"  (1.6 m)   Wt 98.9 kg   SpO2 97%   BMI 38.62 kg/m   Physical Exam Vitals and nursing note reviewed.  Constitutional:      Appearance: Normal appearance.  HENT:     Head: Normocephalic and atraumatic.     Mouth/Throat:     Tonsils: Tonsillar exudate present. No tonsillar abscesses. 2+ on the right. 2+ on the left.  Eyes:     Conjunctiva/sclera: Conjunctivae normal.  Cardiovascular:     Rate and Rhythm: Normal rate and regular rhythm.  Pulmonary:     Effort: Pulmonary effort is normal. No respiratory distress.     Breath sounds: Normal breath sounds.  Abdominal:     General: There is no distension.      Palpations: Abdomen is soft.     Tenderness: There is no abdominal tenderness.  Skin:    General: Skin is warm and dry.  Neurological:     General: No focal deficit present.     Mental Status: She is alert.    ED Results / Procedures / Treatments   Labs (all labs ordered are listed, but only abnormal results are displayed) Labs Reviewed  CBC WITH DIFFERENTIAL/PLATELET - Abnormal; Notable for the following components:      Result Value   WBC 23.6 (*)    Neutro Abs 19.6 (*)    Monocytes Absolute 2.0 (*)    Abs Immature Granulocytes 0.25 (*)    All other components within normal limits  COMPREHENSIVE METABOLIC PANEL - Abnormal; Notable for the following components:   CO2 21 (*)    Glucose, Bld 100 (*)    Calcium 8.8 (*)    All other components within normal limits  MONONUCLEOSIS SCREEN    EKG None  Radiology No results found.  Procedures Procedures   Medications Ordered in ED Medications  sodium chloride 0.9 % bolus 1,000 mL (0 mLs Intravenous Stopped 11/15/20 1319)  methylPREDNISolone sodium succinate (SOLU-MEDROL) 125 mg/2 mL injection 125 mg (125 mg Intravenous Given 11/15/20 1157)    ED Course  I have reviewed the triage vital signs and the nursing notes.  Pertinent labs & imaging results that were available during my care of the patient were reviewed by me and considered in my medical decision making (see chart for details).    MDM Rules/Calculators/A&P                           Patient is otherwise healthy 25 y/o female who presents with sore throat and difficulty swallowing x 2 days. Was seen at UC, strep test negative. COVID test and throat culture pending.   On exam she has 2+ bilateral tonsillar swelling with erythema and white exudate. No evidence of abscess. Given steroids and IVF. Monospot test negative. On reevaluation patient reports marked improvement in her symptoms. She is managing her own secretions. She is afebrile, not tachycardic, and oxygen  saturation is 97% on room air. She does not require admission or inpatient treatment for her symptoms at this time. Plan to d/c to home with antibiotics. Discussed reasons to return to ED. Patient agreeable to plan.   Patient seen in conjunction with attending physician Dr  Zammit MD who agrees with above plan.   Final Clinical Impression(s) / ED Diagnoses Final diagnoses:  Tonsillitis    Rx / DC Orders ED Discharge Orders          Ordered    cephALEXin (KEFLEX) 500 MG capsule  2 times daily        11/15/20 1310             Carle Dargan T, PA-C 11/15/20 1331    Bethann Berkshire, MD 11/18/20 1732

## 2020-11-15 NOTE — ED Notes (Signed)
Pt provided discharge instructions and prescription information. Pt was given the opportunity to ask questions and questions were answered. Discharge signature not obtained in the setting of the COVID-19 pandemic in order to reduce high touch surfaces.  ° °

## 2020-11-16 ENCOUNTER — Other Ambulatory Visit: Payer: Self-pay

## 2020-11-16 DIAGNOSIS — J36 Peritonsillar abscess: Secondary | ICD-10-CM | POA: Insufficient documentation

## 2020-11-16 DIAGNOSIS — Z87891 Personal history of nicotine dependence: Secondary | ICD-10-CM | POA: Diagnosis not present

## 2020-11-16 DIAGNOSIS — J029 Acute pharyngitis, unspecified: Secondary | ICD-10-CM | POA: Diagnosis present

## 2020-11-17 ENCOUNTER — Encounter (HOSPITAL_BASED_OUTPATIENT_CLINIC_OR_DEPARTMENT_OTHER): Payer: Self-pay | Admitting: *Deleted

## 2020-11-17 ENCOUNTER — Emergency Department (HOSPITAL_BASED_OUTPATIENT_CLINIC_OR_DEPARTMENT_OTHER)
Admission: EM | Admit: 2020-11-17 | Discharge: 2020-11-17 | Disposition: A | Discharge status: Home / Self Care | Payer: Medicaid Other | Attending: Emergency Medicine | Admitting: Emergency Medicine

## 2020-11-17 DIAGNOSIS — J36 Peritonsillar abscess: Secondary | ICD-10-CM

## 2020-11-17 DIAGNOSIS — J029 Acute pharyngitis, unspecified: Secondary | ICD-10-CM

## 2020-11-17 LAB — CULTURE, GROUP A STREP (THRC)

## 2020-11-17 MED ORDER — KETOROLAC TROMETHAMINE 30 MG/ML IJ SOLN
30.0000 mg | Freq: Once | INTRAMUSCULAR | Status: AC
  Administered 2020-11-17: 30 mg via INTRAMUSCULAR
  Filled 2020-11-17: qty 1

## 2020-11-17 MED ORDER — AMOXICILLIN-POT CLAVULANATE 875-125 MG PO TABS: 1.0000 | ORAL_TABLET | Freq: Two times a day (BID) | ORAL | 0 refills | Status: AC

## 2020-11-17 MED ORDER — DEXAMETHASONE SODIUM PHOSPHATE 10 MG/ML IJ SOLN
10.0000 mg | Freq: Once | INTRAMUSCULAR | Status: AC
  Administered 2020-11-17: 10 mg via INTRAMUSCULAR
  Filled 2020-11-17: qty 1

## 2020-11-17 MED ORDER — DEXAMETHASONE 6 MG PO TABS: 6.0000 mg | ORAL_TABLET | Freq: Once | ORAL | 0 refills | Status: AC

## 2020-11-17 MED ORDER — AMOXICILLIN-POT CLAVULANATE 875-125 MG PO TABS
1.0000 | ORAL_TABLET | Freq: Once | ORAL | Status: AC
  Administered 2020-11-17: 1 via ORAL
  Filled 2020-11-17: qty 1

## 2020-11-17 NOTE — ED Provider Notes (Signed)
[facility] EMERGENCY DEPT Provider Note  CSN: [id number] Arrival date & time: 11/16/20 2342  Chief Complaint(s) Sore Throat  HPI Judith Lee is a 24 y.o. female here for reevaluation of sore throat.  Patient reports she began having symptoms Thursday and diagnosed with tonsillitis.  She had a negative rapid strep at urgent care.  She was seen yesterday at [facility] and had negative Monospot.  Diagnosed with tonsillitis/pharyngitis and placed on Keflex.  Patient also given Solu-Medrol.  Returns today for worsening pain mostly on the right.  She has difficulty swallowing due to the pain.  No shortness of breath.  She denies fevers but endorses chills.  No nausea or vomiting.  No chest pain or shortness of breath.  No nuchal rigidity.  No abdominal pain.  No other physical complaints.  The history is provided by the patient.   Past Medical History Past Medical History:  Diagnosis Date   Anxiety    Panic attack    Patient Active Problem List   Diagnosis Date Noted   Normal labor 11/16/2015   Preterm premature rupture of membranes 09/10/2012   Home Medication(s) Prior to Admission medications   Medication Sig Start Date End Date Taking? Authorizing Provider  amoxicillin-clavulanate (AUGMENTIN) 875-125 MG tablet Take 1 tablet by mouth every 12 (twelve) hours for 10 days. 11/17/20 11/27/20 Yes Elenora Hawbaker, Amadeo Garnet, MD  dexamethasone (DECADRON) 6 MG tablet Take 1 tablet (6 mg total) by mouth once for 1 dose. Take on 11/19/20 if swelling and pain have not improved. 11/17/20 11/17/20 Yes Sheily Lineman, Amadeo Garnet, MD  ibuprofen (ADVIL,MOTRIN) 600 MG tablet Take 1 tablet (600 mg total) by mouth every 6 (six) hours as needed. Patient not taking: Reported on 11/15/2020 11/18/15   Edwinna Areola, DO  lidocaine (XYLOCAINE) 2 % solution Use as directed 15 mLs in the mouth or throat as needed for mouth pain (Do NOT exceed 8 doses in a 24 hour period). Patient not taking: Reported on  11/15/2020 11/14/20   Wurst, [place], PA-C  metroNIDAZOLE (FLAGYL) 500 MG tablet Take 1 tablet (500 mg total) by mouth 2 (two) times daily. Patient not taking: Reported on 11/15/2020 10/23/20   Brock Bad, MD  oxyCODONE (OXY IR/ROXICODONE) 5 MG immediate release tablet Take 1 tablet (5 mg total) by mouth every 4 (four) hours as needed for severe pain (pain scale 4-7). Patient not taking: Reported on 11/15/2020 11/18/15   Edwinna Areola, DO  sertraline (ZOLOFT) 50 MG tablet Take 50 mg by mouth daily. Patient not taking: Reported on 11/15/2020    [provider]                                                                                                                                    Past Surgical History Past Surgical History:  Procedure Laterality Date   none     WISDOM TOOTH EXTRACTION     Family  History Family History  Problem Relation Age of Onset   Diabetes Mother    Hypertension Mother    Diabetes Father    Hypertension Father     Social History Social History   Tobacco Use   Smoking status: Former   Smokeless tobacco: Never  Substance Use Topics   Alcohol use: No   Drug use: No    Types: Marijuana   Allergies Patient has no known allergies.  Review of Systems Review of Systems All other systems are reviewed and are negative for acute change except as noted in the HPI  Physical Exam Vital Signs  I have reviewed the triage vital signs BP (!) 131/96   Pulse 70   Temp 98.5 F (36.9 C) (Oral)   Resp 18   Wt 98.9 kg   SpO2 100%   BMI 38.62 kg/m   Physical Exam Vitals reviewed.  Constitutional:      General: She is not in acute distress.    Appearance: She is well-developed. She is not diaphoretic.  HENT:     Head: Normocephalic and atraumatic.     Right Ear: External ear normal.     Left Ear: External ear normal.     Nose: Nose normal.     Mouth/Throat:     Pharynx: Oropharyngeal exudate and posterior oropharyngeal erythema  present.     Tonsils: Tonsillar exudate present. 2+ on the right. 1+ on the left.  Eyes:     General: No scleral icterus.    Conjunctiva/sclera: Conjunctivae normal.  Neck:     Trachea: Phonation normal.  Cardiovascular:     Rate and Rhythm: Normal rate and regular rhythm.  Pulmonary:     Effort: Pulmonary effort is normal. No respiratory distress.     Breath sounds: No stridor.  Abdominal:     General: There is no distension.  Musculoskeletal:        General: Normal range of motion.     Cervical back: Normal range of motion.  Lymphadenopathy:     Cervical: Cervical adenopathy present.     Right cervical: Superficial cervical adenopathy and deep cervical adenopathy present.  Neurological:     Mental Status: She is alert and oriented to person, place, and time.  Psychiatric:        Behavior: Behavior normal.    ED Results and Treatments Labs (all labs ordered are listed, but only abnormal results are displayed) Labs Reviewed - No data to display                                                                                                                       EKG  EKG Interpretation  Date/Time:    Ventricular Rate:    PR Interval:    QRS Duration:   QT Interval:    QTC Calculation:   R Axis:     Text Interpretation:         Radiology No results found.  Pertinent labs & imaging results that  were available during my care of the patient were reviewed by me and considered in my medical decision making (see MDM for details).  Medications Ordered in ED Medications  ketorolac (TORADOL) 30 MG/ML injection 30 mg (30 mg Intramuscular Given 11/17/20 0136)  dexamethasone (DECADRON) injection 10 mg (10 mg Intramuscular Given 11/17/20 0136)  amoxicillin-clavulanate (AUGMENTIN) 875-125 MG per tablet 1 tablet (1 tablet Oral Given 11/17/20 0323)                                                                                                                                      Procedures Procedures  (including critical care time)  Medical Decision Making / ED Course I have reviewed the nursing notes for this encounter and the patient's prior records (if available in EHR or on provided paperwork).  Rossetta Kama was evaluated in Emergency Department on 11/17/2020 for the symptoms described in the history of present illness. She was evaluated in the context of the global COVID-19 pandemic, which necessitated consideration that the patient might be at risk for infection with the SARS-CoV-2 virus that causes COVID-19. Institutional protocols and algorithms that pertain to the evaluation of patients at risk for COVID-19 are in a state of rapid change based on information released by regulatory bodies including the CDC and federal and state organizations. These policies and algorithms were followed during the patient's care in the ED.     Exam is suspicious for developing PTA. She is otherwise well appearing, though in obvious discomfort. Her pain improved significantly with IM Toradol and Decadron. I will switch from Keflex to Augmentin.  Tolerating PO.   Final Clinical Impression(s) / ED Diagnoses Final diagnoses:  Pharyngitis, unspecified etiology  Peritonsillar abscess   The patient appears reasonably screened and/or stabilized for discharge and I doubt any other medical condition or other [facility] requiring further screening, evaluation, or treatment in the ED at this time prior to discharge. Safe for discharge with strict return precautions.  Disposition: Discharge  Condition: Good  I have discussed the results, Dx and Tx plan with the patient/family who expressed understanding and agree(s) with the plan. Discharge instructions discussed at length. The patient/family was given strict return precautions who verbalized understanding of the instructions. No further questions at time of discharge.    ED Discharge Orders          Ordered     amoxicillin-clavulanate (AUGMENTIN) 875-125 MG tablet  Every 12 hours        11/17/20 0316    dexamethasone (DECADRON) 6 MG tablet   Once        11/17/20 0316             Follow Up: [facility] [address] [place] [address] [phone] Go to  in 3-5 days, if symptoms do not improve or  worsen     This chart was dictated using voice recognition software.  Despite best efforts to proofread,  errors can occur which can change the documentation meaning.    Nira Conn, MD 11/17/20 [phone]

## 2020-11-17 NOTE — ED Triage Notes (Signed)
Pt is here for worsening pain and swelling from right side of throat.  Pt reports that she was dx with tonsilitis and placed on abt Friday.  Pt has taken 3 doses (one Friday and 2 Saturday).  Pt reports that she was trying to give the abt time to work but she is feeling worse and has not been able to eat and barely able to drink today due to pain.  Pt is is obvious discomfort in triage and is tearful.

## 2020-11-19 LAB — CYTOLOGY, (ORAL, ANAL, URETHRAL) ANCILLARY ONLY
Chlamydia: NEGATIVE
Comment: NEGATIVE
Comment: NEGATIVE
Comment: NORMAL
Neisseria Gonorrhea: NEGATIVE
Trichomonas: NEGATIVE

## 2021-12-09 ENCOUNTER — Encounter: Payer: Self-pay | Admitting: Family Medicine

## 2021-12-09 ENCOUNTER — Ambulatory Visit (LOCAL_COMMUNITY_HEALTH_CENTER): Payer: Medicaid Other | Admitting: Family Medicine

## 2021-12-09 ENCOUNTER — Ambulatory Visit: Payer: Self-pay

## 2021-12-09 VITALS — BP 134/90 | Ht 63.0 in | Wt 229.0 lb

## 2021-12-09 DIAGNOSIS — Z113 Encounter for screening for infections with a predominantly sexual mode of transmission: Secondary | ICD-10-CM

## 2021-12-09 DIAGNOSIS — B9689 Other specified bacterial agents as the cause of diseases classified elsewhere: Secondary | ICD-10-CM

## 2021-12-09 DIAGNOSIS — Z01419 Encounter for gynecological examination (general) (routine) without abnormal findings: Secondary | ICD-10-CM

## 2021-12-09 DIAGNOSIS — Z309 Encounter for contraceptive management, unspecified: Secondary | ICD-10-CM

## 2021-12-09 DIAGNOSIS — Z0389 Encounter for observation for other suspected diseases and conditions ruled out: Secondary | ICD-10-CM | POA: Diagnosis not present

## 2021-12-09 LAB — WET PREP FOR TRICH, YEAST, CLUE
Trichomonas Exam: NEGATIVE
Yeast Exam: NEGATIVE

## 2021-12-09 LAB — HM HIV SCREENING LAB: HM HIV Screening: NEGATIVE

## 2021-12-09 LAB — HM HEPATITIS C SCREENING LAB: HM Hepatitis Screen: NEGATIVE

## 2021-12-09 MED ORDER — METRONIDAZOLE 500 MG PO TABS: 500.0000 mg | ORAL_TABLET | Freq: Two times a day (BID) | ORAL | 0 refills | Status: AC

## 2021-12-09 NOTE — Progress Notes (Signed)
Pt visit initially for PE, STI screening, and IUD removal and reinsertion. However pt reported having her Mirena placed in 2017, so RN explained the updated guidelines for how long the Mirena is good for and pt chose to keep it at this time. Seen by MD Ernestina Patches. Initial lab results reviewed. Pt treated for BV. RN dispensed medication.

## 2021-12-09 NOTE — Progress Notes (Signed)
[facility] Number: [phone]    Family Planning Visit- Initial Visit  Subjective:  Judith Lee is a 26 y.o.  G2P1102   being seen today for an initial annual visit and to discuss reproductive life planning.  The patient is currently using IUD or IUS for pregnancy prevention. Patient reports   does not want a pregnancy in the next year.     report they are looking for a method that provides High efficacy at preventing pregnancy  Patient has the following medical conditions has Preterm premature rupture of membranes and Normal labor on their problem list.  Chief Complaint  Patient presents with   Contraception    Physical, std screening, iud removal and reinsertion patient complains of bleeding after intercourse, having some vaginal discharge    Exposure to STD    Bleeding after intercourse and extra discharge and more frequent urination.     Patient reports IUD placed in 2017. Was here for removal/reinsertion but device is not expired. Informed about safety for 8 years of continuous use.  Has vaginal discharge and reported some vaginal bleeding occasionally. Has Trich in 2022.   Has history of migraines.   Body mass index is 40.57 kg/m. - Patient is eligible for diabetes screening based on BMI and age >70?  no HA1C ordered? no  Patient reports 1  partner/s in last year. Desires STI screening?  Yes  Has patient been screened once for HCV in the past?  No  No results found for: "HCVAB"  Does the patient have current drug use (including MJ), have a partner with drug use, and/or has been incarcerated since last result? No  If yes-- Screen for HCV through [facility]   Does the patient meet criteria for HBV testing? No  Criteria:  -Household, sexual or needle sharing contact with HBV -History of drug use -HIV positive -Those with known Hep C   Health Maintenance Due  Topic Date Due    HEMOGLOBIN A1C  Never done   COVID-19 Vaccine (1) Never done   FOOT EXAM  Never done   OPHTHALMOLOGY EXAM  Never done   HPV VACCINES (2 - 2-dose series) 05/04/2006   Diabetic kidney evaluation - Urine ACR  Never done   INFLUENZA VACCINE  Never done   Diabetic kidney evaluation - GFR measurement  11/15/2021    Review of Systems  Constitutional:  Negative for chills and fever.  Eyes:  Negative for blurred vision and double vision.  Respiratory:  Negative for cough and shortness of breath.   Cardiovascular:  Negative for chest pain and orthopnea.  Gastrointestinal:  Negative for nausea and vomiting.  Genitourinary:  Negative for dysuria, flank pain and frequency.  Musculoskeletal:  Negative for myalgias.  Skin:  Negative for rash.  Neurological:  Negative for dizziness, tingling, weakness and headaches.  Endo/Heme/Allergies:  Does not bruise/bleed easily.  Psychiatric/Behavioral:  Negative for depression and suicidal ideas. The patient is not nervous/anxious.     The following portions of the patient's history were reviewed and updated as appropriate: allergies, current medications, past family history, past medical history, past social history, past surgical history and problem list. Problem list updated.   See flowsheet for other program required questions.  Objective:   Vitals:   12/09/21 1412  BP: (!) 134/90  Weight: 229 lb (103.9 kg)  Height: 5\' 3"  (1.6 m)    Physical Exam Vitals and nursing note reviewed. Exam conducted  with a chaperone present.  Constitutional:      Appearance: Normal appearance.  HENT:     Head: Normocephalic.  Pulmonary:     Effort: Pulmonary effort is normal.  Genitourinary:    Exam position: Lithotomy position.     Labia:        Right: No rash.        Left: No rash.      Vagina: Normal.     Cervix: Normal.     Comments: IUD strings visualized + odor, creamy white discharge pH>4.5  Musculoskeletal:        General: Normal range of motion.   Skin:    General: Skin is warm and dry.  Neurological:     Mental Status: She is alert. Mental status is at baseline.     Assessment and Plan:  Judith Lee is a 26 y.o. female presenting to the [facility] for an initial annual wellness/contraceptive visit  Contraception counseling: Reviewed options based on patient desire and reproductive life plan. Patient is interested in IUD or IUS. Likes her IUD and wants to keep this today  Risks, benefits, and typical effectiveness rates were reviewed.  Questions were answered.  Written information was also given to the patient to review.    The patient will follow up in  1 years for surveillance.  The patient was told to call with any further questions, or with any concerns about this method of contraception.  Emphasized use of condoms 100% of the time for STI prevention.  1. Screening examination for venereal disease - Treat wet mount per standing order (suspected BV- creamy white /dc with ph>4.5) - Chlamydia/Gonorrhea [facility] - HIV/HCV [place] [facility] - Syphilis Serology, [place] [facility] - WET PREP FOR TRICH, YEAST, CLUE  2. Well woman exam with routine gynecological exam Pap is UTD CBE declined IUD counseling performed. Patient chose to keep current device in place.  - Chlamydia/Gonorrhea [place] [facility] - HIV/HCV [place] [facility] - Syphilis Serology, [place] [facility]     Return in about 1 year (around 12/10/2022) for Yearly wellness exam.  No future appointments.  Federico Flake, MD

## 2022-04-28 ENCOUNTER — Emergency Department: Payer: Medicaid Other

## 2022-04-28 ENCOUNTER — Emergency Department
Admission: EM | Admit: 2022-04-28 | Discharge: 2022-04-28 | Disposition: A | Discharge status: Home / Self Care | Payer: Medicaid Other | Attending: Emergency Medicine | Admitting: Emergency Medicine

## 2022-04-28 ENCOUNTER — Other Ambulatory Visit: Payer: Self-pay

## 2022-04-28 ENCOUNTER — Other Ambulatory Visit: Payer: Medicaid Other

## 2022-04-28 DIAGNOSIS — R4182 Altered mental status, unspecified: Secondary | ICD-10-CM | POA: Diagnosis not present

## 2022-04-28 DIAGNOSIS — R55 Syncope and collapse: Secondary | ICD-10-CM | POA: Diagnosis not present

## 2022-04-28 DIAGNOSIS — D72829 Elevated white blood cell count, unspecified: Secondary | ICD-10-CM | POA: Diagnosis not present

## 2022-04-28 DIAGNOSIS — T402X1A Poisoning by other opioids, accidental (unintentional), initial encounter: Secondary | ICD-10-CM

## 2022-04-28 DIAGNOSIS — R9431 Abnormal electrocardiogram [ECG] [EKG]: Secondary | ICD-10-CM | POA: Diagnosis not present

## 2022-04-28 LAB — BASIC METABOLIC PANEL
Anion gap: 12 (ref 5–15)
BUN: 11 mg/dL (ref 6–20)
CO2: 22 mmol/L (ref 22–32)
Calcium: 8.4 mg/dL — ABNORMAL LOW (ref 8.9–10.3)
Chloride: 102 mmol/L (ref 98–111)
Creatinine, Ser: 0.83 mg/dL (ref 0.44–1.00)
GFR, Estimated: >60 mL/min (ref >60)
Glucose, Bld: 181 mg/dL — ABNORMAL HIGH (ref 70–99)
Potassium: 3.2 mmol/L — ABNORMAL LOW (ref 3.5–5.1)
Sodium: 136 mmol/L (ref 135–145)

## 2022-04-28 LAB — CBC
HCT: 42.5 % (ref 36.0–46.0)
Hemoglobin: 13.4 g/dL (ref 12.0–15.0)
MCH: 28.1 pg (ref 26.0–34.0)
MCHC: 31.5 g/dL (ref 30.0–36.0)
MCV: 89.1 fL (ref 80.0–100.0)
Platelets: 307 10*3/uL (ref 150–400)
RBC: 4.77 MIL/uL (ref 3.87–5.11)
RDW: 14 % (ref 11.5–15.5)
WBC: 22.3 10*3/uL — ABNORMAL HIGH (ref 4.0–10.5)
nRBC: 0 % (ref 0.0–0.2)

## 2022-04-28 LAB — D-DIMER, QUANTITATIVE: D-Dimer, Quant: <0.27 ug/mL-FEU (ref 0.00–0.50)

## 2022-04-28 LAB — ETHANOL: Alcohol, Ethyl (B): <10 mg/dL (ref <10)

## 2022-04-28 LAB — TROPONIN I (HIGH SENSITIVITY)
Troponin I (High Sensitivity): 47 ng/L — ABNORMAL HIGH (ref <18)
Troponin I (High Sensitivity): 84 ng/L — ABNORMAL HIGH (ref <18)

## 2022-04-28 MED ORDER — NALOXONE HCL 2 MG/2ML IJ SOSY: 0.4000 mg | PREFILLED_SYRINGE | INTRAMUSCULAR | Status: DC | PRN

## 2022-04-28 MED ORDER — SODIUM CHLORIDE 0.9 % IV BOLUS
1000.0000 mL | Freq: Once | INTRAVENOUS | Status: AC
  Administered 2022-04-28: 1000 mL via INTRAVENOUS

## 2022-04-28 MED ORDER — ONDANSETRON HCL 4 MG/2ML IJ SOLN
4.0000 mg | Freq: Once | INTRAMUSCULAR | Status: AC
  Administered 2022-04-28: 4 mg via INTRAVENOUS
  Filled 2022-04-28: qty 2

## 2022-04-28 MED ORDER — NALOXONE HCL 2 MG/2ML IJ SOSY
0.4000 mg | PREFILLED_SYRINGE | Freq: Once | INTRAMUSCULAR | Status: AC
  Administered 2022-04-28: 0.4 mg via INTRAVENOUS
  Filled 2022-04-28: qty 2

## 2022-04-28 MED ORDER — NALOXONE HCL 4 MG/0.1ML NA LIQD: NASAL | 0 refills | Status: DC

## 2022-04-28 NOTE — ED Notes (Signed)
Patient is still unable to void at this time.

## 2022-04-28 NOTE — ED Triage Notes (Signed)
Pt arrives via EMS from home for syncope. EMS reports pt was breathing 2 breaths per minute on their arrival with pinpoint pupils. EMS gave nasal Narcan and reports pt became responsive 15 minutes later. Pt arrives alert and nauseous. Pt reports smoking marijuana around 2pm.

## 2022-04-28 NOTE — ED Notes (Signed)
Patient is resting in bed. Patient states she wants to go home. Advised I would make the Dr aware.

## 2022-04-28 NOTE — ED Notes (Signed)
Patient ambulated to the commode.Unable to provide a urine sample at this time

## 2022-04-28 NOTE — ED Provider Notes (Signed)
[facility] Provider Note    Event Date/Time   First MD Initiated Contact with Patient 04/28/22 1805     (approximate)   History   Loss of Consciousness   HPI  Judith Lee is a 27 y.o. female past medical history significant for tobacco use, marijuana use, who presents to the emergency department following a syncopal episode.  History is provided by EMS and boyfriend at bedside.  States that she was only smoking marijuana with her boyfriend's mother.  Boyfriend stated that she was taking a really long shower, went to check on her and found her passed out in the shower.  When EMS arrived she was not breathing, pinpoint pupils and was unresponsive.  Patient received intranasal Narcan.  Approximately 15 minutes later was responsive.  Denies any chest pain or shortness of breath.  States that she had a headache earlier today.  Does endorse nausea and multiple episodes of vomiting.  No concern for pregnancy.  States that she has to IUD in place.  Nicotine use.  No prior history of DVT or PE.     Physical Exam   Triage Vital Signs: ED Triage Vitals [04/28/22 1805]  Enc Vitals Group     BP      Pulse      Resp      Temp      Temp src      SpO2      Weight      Height      Head Circumference      Peak Flow      Pain Score 0     Pain Loc      Pain Edu?      Excl. in [place]?     Most recent vital signs: Vitals:   04/28/22 2200 04/28/22 2230  BP: 118/63 121/75  Pulse: 65 80  Resp: 12 16  SpO2: 100% 93%    Physical Exam Constitutional:      Appearance: She is well-developed.  HENT:     Head: Atraumatic.  Eyes:     Conjunctiva/sclera: Conjunctivae normal.  Cardiovascular:     Rate and Rhythm: Regular rhythm.  Pulmonary:     Effort: No respiratory distress.  Abdominal:     General: There is no distension.  Musculoskeletal:        General: Normal range of motion.     Cervical back: Normal range of motion.  Skin:    General: Skin is warm.      Capillary Refill: Capillary refill takes less than 2 seconds.  Neurological:     Mental Status: She is alert. Mental status is at baseline.     IMPRESSION / MDM / ASSESSMENT AND PLAN / ED COURSE  I reviewed the triage vital signs and the nursing notes.  Patient received Narcan prior to arrival.  On arrival to the emergency department patient actively vomiting.  Given IV Zofran.  Differential diagnosis including opioid overdose, accidental, electrolyte abnormality, ACS, PE.  EKG  I, Nathaniel Man, the attending physician, personally viewed and interpreted this ECG.   Rate: Normal  Rhythm: Normal sinus  Axis: Normal  Intervals: Normal  ST&T Change: None  No tachycardic or bradycardic dysrhythmias while on cardiac telemetry.  RADIOLOGY I independently reviewed imaging, my interpretation of imaging: CT scan of head with no signs of intracranial hemorrhage or infarction.  Chest x-ray with findings consistent with pneumonia.  Read as no acute findings.  LABS (all labs ordered are listed,  but only abnormal results are displayed) Labs interpreted as -    Labs Reviewed  CBC - Abnormal; Notable for the following components:      Result Value   WBC 22.3 (*)    All other components within normal limits  BASIC METABOLIC PANEL - Abnormal; Notable for the following components:   Potassium 3.2 (*)    Glucose, Bld 181 (*)    Calcium 8.4 (*)    All other components within normal limits  TROPONIN I (HIGH SENSITIVITY) - Abnormal; Notable for the following components:   Troponin I (High Sensitivity) 47 (*)    All other components within normal limits  TROPONIN I (HIGH SENSITIVITY) - Abnormal; Notable for the following components:   Troponin I (High Sensitivity) 84 (*)    All other components within normal limits  ETHANOL  D-DIMER, QUANTITATIVE  URINE DRUG SCREEN, QUALITATIVE ([facility] ONLY)  POC URINE PREG, ED    TREATMENT  Narcan, IV Zofran, IV fluids  MDM    Patient's lab  work with leukocytosis however likely in the sickening of demargination from vomiting.  No signs or symptoms significant for an infectious process.  Troponin is elevated bolus likely secondary elevation secondary to her opioid overdose.  Low risk Wells criteria and D-dimer is negative no concern for PE.  Alcohol level negative.    Called back into the room because patient was hypoxic with decreased respirations.  Pinpoint pupils.  Patient was given a second dose of Narcan with resolution of her symptoms.  Patient was monitored for 2 hours after Narcan.  States that she is feeling better.  States that she will go pick up Narcan and have it with her.  Given information for outpatient resources for opioid abuse issues.  States that she will follow-up as an outpatient.  Patient has elevated troponin but no signs or symptoms concerning for ACS or dysrhythmia.  Discussed close follow-up as an outpatient.  Given return precautions.   PROCEDURES:  Critical Care performed: yes  .Critical Care  Performed by: Nathaniel Man, MD Authorized by: Nathaniel Man, MD   Critical care provider statement:    Critical care time (minutes):  30   Critical care time was exclusive of:  Separately billable procedures and treating other patients   Critical care was necessary to treat or prevent imminent or life-threatening deterioration of the following conditions:  Toxidrome   Critical care was time spent personally by me on the following activities:  Development of treatment plan with patient or surrogate, discussions with consultants, evaluation of patient's response to treatment, examination of patient, ordering and review of laboratory studies, ordering and review of radiographic studies, ordering and performing treatments and interventions, pulse oximetry, re-evaluation of patient's condition and review of old charts   Patient's presentation is most consistent with acute presentation with potential threat to  life or bodily function.   MEDICATIONS ORDERED IN ED: Medications  naloxone (NARCAN) injection 0.4 mg (has no administration in time range)  ondansetron (ZOFRAN) injection 4 mg (4 mg Intravenous Given 04/28/22 1831)  sodium chloride 0.9 % bolus 1,000 mL (0 mLs Intravenous Stopped 04/28/22 2204)  ondansetron (ZOFRAN) injection 4 mg (4 mg Intravenous Given 04/28/22 2024)  naloxone [facility]) injection 0.4 mg (0.4 mg Intravenous Given 04/28/22 2028)  sodium chloride 0.9 % bolus 1,000 mL (1,000 mLs Intravenous New Bag/Given 04/28/22 2208)    FINAL CLINICAL IMPRESSION(S) / ED DIAGNOSES   Final diagnoses:  Opioid overdose, accidental or unintentional, initial encounter ([place])  Rx / DC Orders   ED Discharge Orders          Ordered    naloxone (NARCAN) nasal spray 4 mg/0.1 mL        04/28/22 2309             Note:  This document was prepared using Dragon voice recognition software and may include unintentional dictation errors.   Nathaniel Man, MD 04/28/22 2318

## 2022-04-30 ENCOUNTER — Telehealth: Payer: Self-pay | Admitting: Obstetrics and Gynecology

## 2022-04-30 NOTE — Transitions of Care (Post Inpatient/ED Visit) (Signed)
   04/30/2022  Name: Judith Lee MRN: [id number] DOB: [birth year]  Today's TOC FU Call Status: Today's TOC FU Call Status:: Successful TOC FU Call Competed TOC FU Call Complete Date: 04/30/22  Transition Care Management Follow-up Telephone Call Date of Discharge: 04/28/22 [facility] Type of Discharge: Emergency Department Reason for ED Visit: Other: (syncope) How have you been since you were released from the hospital?: Better Any questions or concerns?: No  Items Reviewed: Did you receive and understand the discharge instructions provided?: Yes Medications obtained and verified?: No Medications Not Reviewed Reasons:: Other: (patient has not picked up narcan yet) Any new allergies since your discharge?: No Dietary orders reviewed?: NA Do you have support at home?: Yes People in Home: significant other Name of Support/Comfort Primary Source: [facility] and Equipment/Supplies: [employer] Ordered?: NA Any new equipment or medical supplies ordered?: NA  Functional Questionnaire: Do you need assistance with bathing/showering or dressing?: No Do you need assistance with meal preparation?: No Do you need assistance with eating?: No Do you have difficulty maintaining continence: No Do you need assistance with getting out of bed/getting out of a chair/moving?: No Do you have difficulty managing or taking your medications?: No  Folllow up appointments reviewed: PCP Follow-up appointment confirmed?: NA Specialist Hospital Follow-up appointment confirmed?: NA Do you need transportation to your follow-up appointment?: No Do you understand care options if your condition(s) worsen?: Yes-patient verbalized understanding   Aida Raider RN, BSN [place] Management Coordinator - Managed [place] High Risk [phone]

## 2022-07-30 DIAGNOSIS — Z113 Encounter for screening for infections with a predominantly sexual mode of transmission: Secondary | ICD-10-CM | POA: Diagnosis not present

## 2022-07-30 DIAGNOSIS — Z114 Encounter for screening for human immunodeficiency virus [HIV]: Secondary | ICD-10-CM | POA: Diagnosis not present

## 2022-07-30 DIAGNOSIS — R309 Painful micturition, unspecified: Secondary | ICD-10-CM | POA: Diagnosis not present

## 2022-07-30 DIAGNOSIS — B3731 Acute candidiasis of vulva and vagina: Secondary | ICD-10-CM | POA: Diagnosis not present

## 2022-08-24 DIAGNOSIS — Z30432 Encounter for removal of intrauterine contraceptive device: Secondary | ICD-10-CM | POA: Diagnosis not present

## 2022-11-06 ENCOUNTER — Emergency Department: Payer: Medicaid Other

## 2022-11-06 ENCOUNTER — Encounter: Payer: Self-pay | Admitting: Emergency Medicine

## 2022-11-06 ENCOUNTER — Emergency Department
Admission: EM | Admit: 2022-11-06 | Discharge: 2022-11-06 | Disposition: A | Discharge status: Home / Self Care | Payer: Medicaid Other | Attending: Emergency Medicine | Admitting: Emergency Medicine

## 2022-11-06 ENCOUNTER — Other Ambulatory Visit: Payer: Self-pay

## 2022-11-06 DIAGNOSIS — M62831 Muscle spasm of calf: Secondary | ICD-10-CM | POA: Insufficient documentation

## 2022-11-06 DIAGNOSIS — M545 Low back pain, unspecified: Secondary | ICD-10-CM | POA: Insufficient documentation

## 2022-11-06 DIAGNOSIS — M79605 Pain in left leg: Secondary | ICD-10-CM | POA: Diagnosis present

## 2022-11-06 DIAGNOSIS — M62838 Other muscle spasm: Secondary | ICD-10-CM

## 2022-11-06 DIAGNOSIS — R600 Localized edema: Secondary | ICD-10-CM | POA: Diagnosis not present

## 2022-11-06 LAB — CBC
HCT: 40.9 % (ref 36.0–46.0)
Hemoglobin: 13.3 g/dL (ref 12.0–15.0)
MCH: 27.4 pg (ref 26.0–34.0)
MCHC: 32.5 g/dL (ref 30.0–36.0)
MCV: 84.3 fL (ref 80.0–100.0)
Platelets: 336 10*3/uL (ref 150–400)
RBC: 4.85 MIL/uL (ref 3.87–5.11)
RDW: 14.3 % (ref 11.5–15.5)
WBC: 12.4 10*3/uL — ABNORMAL HIGH (ref 4.0–10.5)
nRBC: 0 % (ref 0.0–0.2)

## 2022-11-06 LAB — BASIC METABOLIC PANEL
Anion gap: 8 (ref 5–15)
BUN: 9 mg/dL (ref 6–20)
CO2: 23 mmol/L (ref 22–32)
Calcium: 8.8 mg/dL — ABNORMAL LOW (ref 8.9–10.3)
Chloride: 105 mmol/L (ref 98–111)
Creatinine, Ser: 0.65 mg/dL (ref 0.44–1.00)
GFR, Estimated: >60 mL/min (ref >60)
Glucose, Bld: 113 mg/dL — ABNORMAL HIGH (ref 70–99)
Potassium: 3.9 mmol/L (ref 3.5–5.1)
Sodium: 136 mmol/L (ref 135–145)

## 2022-11-06 LAB — CK: Total CK: 91 U/L (ref 38–234)

## 2022-11-06 MED ORDER — SODIUM CHLORIDE 0.9 % IV BOLUS
1000.0000 mL | Freq: Once | INTRAVENOUS | Status: AC
  Administered 2022-11-06: 1000 mL via INTRAVENOUS

## 2022-11-06 MED ORDER — OXYCODONE-ACETAMINOPHEN 5-325 MG PO TABS
1.0000 | ORAL_TABLET | Freq: Once | ORAL | Status: AC
  Administered 2022-11-06: 1 via ORAL
  Filled 2022-11-06: qty 1

## 2022-11-06 MED ORDER — NAPROXEN 500 MG PO TABS: 500.0000 mg | ORAL_TABLET | Freq: Two times a day (BID) | ORAL | 0 refills | Status: DC

## 2022-11-06 MED ORDER — MORPHINE SULFATE (PF) 4 MG/ML IV SOLN
4.0000 mg | Freq: Once | INTRAVENOUS | Status: AC
  Administered 2022-11-06: 4 mg via INTRAVENOUS
  Filled 2022-11-06: qty 1

## 2022-11-06 MED ORDER — KETOROLAC TROMETHAMINE 30 MG/ML IJ SOLN
15.0000 mg | Freq: Once | INTRAMUSCULAR | Status: AC
  Administered 2022-11-06: 15 mg via INTRAVENOUS
  Filled 2022-11-06: qty 1

## 2022-11-06 MED ORDER — DIAZEPAM 2 MG PO TABS
2.0000 mg | ORAL_TABLET | Freq: Once | ORAL | Status: AC
  Administered 2022-11-06: 2 mg via ORAL
  Filled 2022-11-06: qty 1

## 2022-11-06 MED ORDER — DIAZEPAM 5 MG PO TABS: 5.0000 mg | ORAL_TABLET | Freq: Three times a day (TID) | ORAL | 0 refills | Status: DC | PRN

## 2022-11-06 NOTE — ED Provider Notes (Signed)
Care of this patient assumed from prior physician at 0700 pending ultrasound, reevaluation, and disposition. Please see prior physician note for further details.  Briefly this is a 27 year old female who presented with "restless legs" for several nights.  Noted to be neurovascularly intact on exam.  Still has some symptoms after multiple rounds of medications, so bilateral DVT ultrasounds were ordered.  These did return without evidence of DVT or other acute findings.  Patient was reevaluated and was eager to be discharged home.  Do think this is reasonable.  Strict return precautions provided.  Patient discharged in stable condition.   Trinna Post, MD 11/06/22 [phone]

## 2022-11-06 NOTE — ED Triage Notes (Signed)
Patient ambulatory to triage with steady gait, without difficulty or distress noted; pt reports having "restless legs" for several nights; st hx of same but never had meds for such

## 2022-11-06 NOTE — ED Notes (Signed)
RN into discharge pt. Upon RN arrival pt had removed IV. Pt stated "it was bothering me and I put it in that box over there", pointing to the sharp box. RN visualized IV site and IV had been removed with guaze and tape. No bleeding at this time.

## 2022-11-06 NOTE — ED Notes (Addendum)
RN at bedside to answer call bell. Pt walking around in room. Pt inquiring about discharge and needing a work note. RN stating waiting for [place] results

## 2022-11-06 NOTE — ED Provider Notes (Signed)
[facility] Provider Note    Event Date/Time   First MD Initiated Contact with Patient 11/06/22 [phone]     (approximate)   History   Claudication   HPI  Judith Lee is a 27 y.o. female  who presents to the ED from home with a chief complaint of "restless legs" x several nights. Started working a second job prior to onset of symptoms. 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 Complains of bilateral leg cramping and lower back pain. Denies fall/trauma/injury. Denies fever/chills, chest pain, shortness of breath, abdominal pain, nausea, vomiting or dizziness.  Denies extremity weakness/numbness/tingling.  Denies bowel or bladder incontinence.      Past Medical History   Past Medical History:  Diagnosis Date   Anxiety    Panic attack    Sickle cell trait [facility])      Active Problem List   Patient Active Problem List   Diagnosis Date Noted   Normal labor 11/16/2015   Preterm premature rupture of membranes 09/10/2012     Past Surgical History   Past Surgical History:  Procedure Laterality Date   none     WISDOM TOOTH EXTRACTION       Home Medications   Prior to Admission medications   Medication Sig Start Date End Date Taking? Authorizing Provider  diazepam (VALIUM) 5 MG tablet Take 1 tablet (5 mg total) by mouth every 8 (eight) hours as needed for anxiety. 11/06/22  Yes Irean Hong, MD  naproxen (NAPROSYN) 500 MG tablet Take 1 tablet (500 mg total) by mouth 2 (two) times daily with a meal. 11/06/22  Yes Irean Hong, MD  ibuprofen (ADVIL,MOTRIN) 600 MG tablet Take 1 tablet (600 mg total) by mouth every 6 (six) hours as needed. Patient not taking: Reported on 11/15/2020 11/18/15   Edwinna Areola, DO  lidocaine (XYLOCAINE) 2 % solution Use as directed 15 mLs in the mouth or throat as needed for mouth pain (Do NOT exceed 8 doses in a 24 hour period). 11/14/20   Wurst, [place], PA-C  metroNIDAZOLE (FLAGYL) 500 MG tablet Take 1  tablet (500 mg total) by mouth 2 (two) times daily. 10/23/20   Brock Bad, MD  naloxone [facility]) nasal spray 4 mg/0.1 mL signed 04/28/22   Corena Herter, MD  oxyCODONE (OXY IR/ROXICODONE) 5 MG immediate release tablet Take 1 tablet (5 mg total) by mouth every 4 (four) hours as needed for severe pain (pain scale 4-7). 11/18/15   Edwinna Areola, DO  sertraline (ZOLOFT) 50 MG tablet Take 50 mg by mouth daily. Patient not taking: Reported on 11/15/2020    [provider]     Allergies  Patient has no known allergies.   Family History   Family History  Problem Relation Age of Onset   Diabetes Mother    Hypertension Mother    Diabetes Father    Hypertension Father    Lung cancer Maternal Grandmother      Physical Exam  Triage Vital Signs: ED Triage Vitals  Encounter Vitals Group     BP 11/06/22 0405 138/83     Systolic BP Percentile --      Diastolic BP Percentile --      Pulse Rate 11/06/22 0405 72     Resp 11/06/22 0405 19     Temp 11/06/22 0405 (!) 97.4 F (36.3 C)     Temp Source 11/06/22 0405 Oral     SpO2 11/06/22 0405  100 %     Weight 11/06/22 0400 280 lb (127 kg)     Height 11/06/22 0400 5\' 3"  (1.6 m)     Head Circumference --      Peak Flow --      Pain Score 11/06/22 0401 5     Pain Loc --      Pain Education --      Exclude from Growth Chart --     Updated Vital Signs: BP 138/83 (BP Location: Right Arm)   Pulse 72   Temp (!) 97.4 F (36.3 C) (Oral)   Resp 19   Ht 5\' 3"  (1.6 m)   Wt 127 kg   LMP 10/22/2022 (Exact Date)   SpO2 100%   BMI 49.60 kg/m    General: Awake, mild distress.  CV:  RRR.  Good peripheral perfusion.  Resp:  Normal effort.  CTAB. Abd:  No distention.  Other:  Pacing around the room, appears uncomfortable.  5/5 motor strength and sensation BLE.  Symmetrically warm limbs without evidence of ischemia.  2+ distal pulses.  Brisk, less than 5-second capillary refill.   ED Results / Procedures / Treatments   Labs (all labs ordered are listed, but only abnormal results are displayed) Labs Reviewed  CBC - Abnormal; Notable for the following components:      Result Value   WBC 12.4 (*)    All other components within normal limits  BASIC METABOLIC PANEL - Abnormal; Notable for the following components:   Glucose, Bld 113 (*)    Calcium 8.8 (*)    All other components within normal limits  CK     EKG  None   RADIOLOGY None   Official radiology report(s): No results found.   PROCEDURES:  Critical Care performed: No  Procedures   MEDICATIONS ORDERED IN ED: Medications  oxyCODONE-acetaminophen (PERCOCET/ROXICET) 5-325 MG per tablet 1 tablet (has no administration in time range)  sodium chloride 0.9 % bolus 1,000 mL (0 mLs Intravenous Stopped 11/06/22 0551)  ketorolac (TORADOL) 30 MG/ML injection 15 mg (15 mg Intravenous Given 11/06/22 0439)  diazepam (VALIUM) tablet 2 mg (2 mg Oral Given 11/06/22 0439)  morphine (PF) 4 MG/ML injection 4 mg (4 mg Intravenous Given 11/06/22 0558)     IMPRESSION / MDM / ASSESSMENT AND PLAN / ED COURSE  I reviewed the triage vital signs and the nursing notes.                             27 year old female presenting with muscle spasms.  History of sickle cell trait, no history of pain crises.  Differential diagnosis includes but is not limited to stress reaction, dehydration, rhabdomyolysis, electrolyte abnormalities, etc.  I personally reviewed patient's records and note patient was seen in the ED for accidental opioid overdose in February 2024.  Patient's presentation is most consistent with acute complicated illness / injury requiring diagnostic workup.  Will obtain basic lab work, initiate IV fluid hydration, IV Toradol, oral Valium and reassess.  Clinical Course as of 11/06/22 0655  Fri Nov 06, 2022  0542 Waking patient from sleep to update her of test results.  Once awake, she states her legs continue to spasm. Will try IV Morphine and  reassess. [JS]  [id number] Patient states morphine worked for a little while but legs are cramping again, left leg worse than right.  States it is really cramping around her thigh.  Reexamined both legs which are  symmetrically warm without evidence for ischemia.  Both calves are supple without tenderness.  2+ distal pulses, brisk cap less than 5-second capillary refill.  Given patient's persistent symptoms, will obtain DVT ultrasound. Care will be transferred to the oncoming provider at change of shift. [JS]    Clinical Course User Index [JS] Irean Hong, MD     FINAL CLINICAL IMPRESSION(S) / ED DIAGNOSES   Final diagnoses:  Muscle spasms of both lower extremities     Rx / DC Orders   ED Discharge Orders          Ordered    naproxen (NAPROSYN) 500 MG tablet  2 times daily with meals        11/06/22 0548    diazepam (VALIUM) 5 MG tablet  Every 8 hours PRN        11/06/22 0548             Note:  This document was prepared using Dragon voice recognition software and may include unintentional dictation errors.   Irean Hong, MD 11/06/22 [phone]

## 2022-11-06 NOTE — Discharge Instructions (Signed)
You may take medicines as needed for pain and muscle spasms.  Drink plenty of fluids daily.  Return to the ER for worsening symptoms, persistent vomiting, difficulty breathing or other concerns.

## 2022-11-06 NOTE — ED Notes (Signed)
Pt to nurse's station asking for discharge papers. Pt updated on plan of care. Pt verbalized understanding. Pt ambulatory back to room. Gait steady.

## 2022-11-09 NOTE — Group Note (Deleted)

## 2022-12-21 ENCOUNTER — Ambulatory Visit: Payer: Medicaid Other | Admitting: Family Medicine

## 2022-12-21 NOTE — Progress Notes (Deleted)
New patient visit   Patient: Judith Lee   DOB: 08-Mar-1995   27 y.o. Female  MRN: [id number] Visit Date: 12/21/2022  Today's healthcare provider: Sherlyn Hay, DO   No chief complaint on file.  Subjective    Judith Lee is a 27 y.o. female who presents today as a new patient to establish care.  HPI    ***  Past Medical History:  Diagnosis Date   Anxiety    Panic attack    Sickle cell trait (HCC)    Past Surgical History:  Procedure Laterality Date   none     WISDOM TOOTH EXTRACTION     Family Status  Relation Name Status   Mother  Alive   Father  Alive   MGM  Deceased   MGF  Deceased   PGM  Deceased   PGF  Deceased  No partnership data on file   Family History  Problem Relation Age of Onset   Diabetes Mother    Hypertension Mother    Diabetes Father    Hypertension Father    Lung cancer Maternal Grandmother    Social History   Socioeconomic History   Marital status: Married    Spouse name: Not on file   Number of children: Not on file   Years of education: Not on file   Highest education level: Not on file  Occupational History   Not on file  Tobacco Use   Smoking status: Every Day    Current packs/day: 0.50    Average packs/day: 0.5 packs/day for 10.0 years (5.0 ttl pk-yrs)    Types: Cigarettes   Smokeless tobacco: Never  Vaping Use   Vaping status: Former   Substances: Nicotine, Flavoring  Substance and Sexual Activity   Alcohol use: Yes    Alcohol/week: 1.0 standard drink of alcohol    Types: 1 Shots of liquor per week    Comment: 2-3 drinks a month   Drug use: Not Currently    Types: Marijuana   Sexual activity: Yes    Partners: Male    Birth control/protection: I.U.D.  Other Topics Concern   Not on file  Social History Narrative   Not on file   Social Determinants of Health   Financial Resource Strain: Not on file  Food Insecurity: Not on file  Transportation Needs: Not on file  Physical Activity: Not on file   Stress: Not on file  Social Connections: Not on file   Outpatient Medications Prior to Visit  Medication Sig   diazepam (VALIUM) 5 MG tablet Take 1 tablet (5 mg total) by mouth every 8 (eight) hours as needed for anxiety.   ibuprofen (ADVIL,MOTRIN) 600 MG tablet Take 1 tablet (600 mg total) by mouth every 6 (six) hours as needed. (Patient not taking: Reported on 11/15/2020)   lidocaine (XYLOCAINE) 2 % solution Use as directed 15 mLs in the mouth or throat as needed for mouth pain (Do NOT exceed 8 doses in a 24 hour period).   metroNIDAZOLE (FLAGYL) 500 MG tablet Take 1 tablet (500 mg total) by mouth 2 (two) times daily.   naloxone (NARCAN) nasal spray 4 mg/0.1 mL signed   naproxen (NAPROSYN) 500 MG tablet Take 1 tablet (500 mg total) by mouth 2 (two) times daily with a meal.   oxyCODONE (OXY IR/ROXICODONE) 5 MG immediate release tablet Take 1 tablet (5 mg total) by mouth every 4 (four) hours as needed for severe pain (pain scale 4-7).   sertraline (ZOLOFT)  50 MG tablet Take 50 mg by mouth daily. (Patient not taking: Reported on 11/15/2020)   No facility-administered medications prior to visit.   No Known Allergies  Immunization History  Administered Date(s) Administered   HPV Quadrivalent 11/03/2005   Hepatitis A 11/03/2005   Hepatitis B 11-13-93, 06/30/1993, 06/01/1995   MMR 06/01/1995, 03/22/1997   Tdap 08/29/2015   Varicella 06/13/2001    Health Maintenance  Topic Date Due   HEMOGLOBIN A1C  Never done   FOOT EXAM  Never done   OPHTHALMOLOGY EXAM  Never done   HPV VACCINES (2 - 2-dose series) 05/04/2006   Diabetic kidney evaluation - Urine ACR  Never done   INFLUENZA VACCINE  10/01/2022   COVID-19 Vaccine (1 - 2023-24 season) Never done   Cervical Cancer Screening (Pap smear)  10/19/2023   Diabetic kidney evaluation - eGFR measurement  11/06/2023   DTaP/Tdap/Td (2 - Td or Tdap) 08/28/2025   Hepatitis C Screening  Completed   HIV Screening  Completed    Patient Care  Team: Pcp, No as PCP - General  Review of Systems  {Insert previous labs (optional):23779} {See past labs  Heme  Chem  Endocrine  Serology  Results Review (optional):1}   Objective    There were no vitals taken for this visit. {Insert last BP/Wt (optional):23777}{See vitals history (optional):1}   Physical Exam ***  Depression Screen    12/09/2021    2:26 PM  PHQ 2/9 Scores  PHQ - 2 Score 3   No results found for any visits on 12/21/22.  Assessment & Plan     There are no diagnoses linked to this encounter.   ***  No follow-ups on file.     I discussed the assessment and treatment plan with the patient  The patient was provided an opportunity to ask questions and all were answered. The patient agreed with the plan and demonstrated an understanding of the instructions.   The patient was advised to call back or seek an in-person evaluation if the symptoms worsen or if the condition fails to improve as anticipated.    Sherlyn Hay, DO  [facility] [phone] (phone) [phone] (fax)  [facility]

## 2023-01-05 NOTE — Progress Notes (Unsigned)
New patient visit   Patient: Judith Lee   DOB: 05-14-1995   27 y.o. Female  MRN: [id number] Visit Date: 01/06/2023  Today's healthcare provider: Ronnald Ramp, MD   No chief complaint on file.  Subjective    Judith Lee is a 27 y.o. female who presents today as a new patient to establish care.      Discussed the use of AI scribe software for clinical note transcription with the patient, who gave verbal consent to proceed.  History of Present Illness              Past Medical History:  Diagnosis Date   Anxiety    Panic attack    Sickle cell trait (HCC)     Outpatient Medications Prior to Visit  Medication Sig   diazepam (VALIUM) 5 MG tablet Take 1 tablet (5 mg total) by mouth every 8 (eight) hours as needed for anxiety.   ibuprofen (ADVIL,MOTRIN) 600 MG tablet Take 1 tablet (600 mg total) by mouth every 6 (six) hours as needed. (Patient not taking: Reported on 11/15/2020)   lidocaine (XYLOCAINE) 2 % solution Use as directed 15 mLs in the mouth or throat as needed for mouth pain (Do NOT exceed 8 doses in a 24 hour period).   metroNIDAZOLE (FLAGYL) 500 MG tablet Take 1 tablet (500 mg total) by mouth 2 (two) times daily.   naloxone (NARCAN) nasal spray 4 mg/0.1 mL signed   naproxen (NAPROSYN) 500 MG tablet Take 1 tablet (500 mg total) by mouth 2 (two) times daily with a meal.   oxyCODONE (OXY IR/ROXICODONE) 5 MG immediate release tablet Take 1 tablet (5 mg total) by mouth every 4 (four) hours as needed for severe pain (pain scale 4-7).   sertraline (ZOLOFT) 50 MG tablet Take 50 mg by mouth daily. (Patient not taking: Reported on 11/15/2020)   No facility-administered medications prior to visit.    Past Surgical History:  Procedure Laterality Date   none     WISDOM TOOTH EXTRACTION     Family Status  Relation Name Status   Mother  Alive   Father  Alive   MGM  Deceased   MGF  Deceased   PGM  Deceased   PGF  Deceased  No partnership data on  file   Family History  Problem Relation Age of Onset   Diabetes Mother    Hypertension Mother    Diabetes Father    Hypertension Father    Lung cancer Maternal Grandmother    Social History   Socioeconomic History   Marital status: Married    Spouse name: Not on file   Number of children: Not on file   Years of education: Not on file   Highest education level: Not on file  Occupational History   Not on file  Tobacco Use   Smoking status: Every Day    Current packs/day: 0.50    Average packs/day: 0.5 packs/day for 10.0 years (5.0 ttl pk-yrs)    Types: Cigarettes   Smokeless tobacco: Never  Vaping Use   Vaping status: Former   Substances: Nicotine, Flavoring  Substance and Sexual Activity   Alcohol use: Yes    Alcohol/week: 1.0 standard drink of alcohol    Types: 1 Shots of liquor per week    Comment: 2-3 drinks a month   Drug use: Not Currently    Types: Marijuana   Sexual activity: Yes    Partners: Male    Birth control/protection: I.U.D.  Other Topics Concern   Not on file  Social History Narrative   Not on file   Social Determinants of Health   Financial Resource Strain: Not on file  Food Insecurity: Not on file  Transportation Needs: Not on file  Physical Activity: Not on file  Stress: Not on file  Social Connections: Not on file     No Known Allergies  Immunization History  Administered Date(s) Administered   HPV Quadrivalent 11/03/2005   Hepatitis A 11/03/2005   Hepatitis B 14-May-1993, 06/30/1993, 06/01/1995   MMR 06/01/1995, 03/22/1997   Tdap 08/29/2015   Varicella 06/13/2001    Health Maintenance  Topic Date Due   HEMOGLOBIN A1C  Never done   FOOT EXAM  Never done   OPHTHALMOLOGY EXAM  Never done   HPV VACCINES (2 - 2-dose series) 05/04/2006   Diabetic kidney evaluation - Urine ACR  Never done   INFLUENZA VACCINE  10/01/2022   COVID-19 Vaccine (1 - 2023-24 season) Never done   Cervical Cancer Screening (Pap smear)  10/19/2023    Diabetic kidney evaluation - eGFR measurement  11/06/2023   DTaP/Tdap/Td (2 - Td or Tdap) 08/28/2025   Hepatitis C Screening  Completed   HIV Screening  Completed    Patient Care Team: Pcp, No as PCP - General  Review of Systems  {Insert previous labs (optional):23779} {See past labs  Heme  Chem  Endocrine  Serology  Results Review (optional):1}   Objective    There were no vitals taken for this visit. {Insert last BP/Wt (optional):23777}{See vitals history (optional):1}    Depression Screen    12/09/2021    2:26 PM  PHQ 2/9 Scores  PHQ - 2 Score 3   No results found for any visits on 01/06/23.   Physical Exam ***    Assessment & Plan      Problem List Items Addressed This Visit   None   Assessment and Plan               No follow-ups on file.      Ronnald Ramp, MD  [facility] [phone] (phone) [phone] (fax)  [facility]

## 2023-01-06 ENCOUNTER — Ambulatory Visit: Payer: Medicaid Other | Admitting: Family Medicine

## 2023-01-06 ENCOUNTER — Encounter: Payer: Self-pay | Admitting: Family Medicine

## 2023-01-06 VITALS — BP 132/79 | HR 88 | Ht 63.0 in | Wt 240.0 lb

## 2023-01-06 DIAGNOSIS — Z23 Encounter for immunization: Secondary | ICD-10-CM

## 2023-01-06 DIAGNOSIS — D573 Sickle-cell trait: Secondary | ICD-10-CM | POA: Diagnosis not present

## 2023-01-06 DIAGNOSIS — L700 Acne vulgaris: Secondary | ICD-10-CM | POA: Diagnosis not present

## 2023-01-06 DIAGNOSIS — Z9151 Personal history of suicidal behavior: Secondary | ICD-10-CM | POA: Insufficient documentation

## 2023-01-06 DIAGNOSIS — G2581 Restless legs syndrome: Secondary | ICD-10-CM

## 2023-01-06 DIAGNOSIS — L732 Hidradenitis suppurativa: Secondary | ICD-10-CM | POA: Diagnosis not present

## 2023-01-06 DIAGNOSIS — F339 Major depressive disorder, recurrent, unspecified: Secondary | ICD-10-CM

## 2023-01-06 MED ORDER — BUPROPION HCL ER (XL) 150 MG PO TB24: 150.0000 mg | ORAL_TABLET | Freq: Every day | ORAL | 1 refills | Status: DC

## 2023-01-06 NOTE — Patient Instructions (Signed)
VISIT SUMMARY:  Today, we discussed several health concerns, including your mental health, smoking habits, cannabis use, restless leg syndrome, and a recent skin condition. We have created a plan to address each of these issues and will follow up in a few weeks to assess your progress.  YOUR PLAN:  -MAJOR DEPRESSIVE DISORDER: Major depressive disorder is a mental health condition characterized by persistent feelings of sadness and loss of interest. We will start you on Wellbutrin 150mg  daily and refer you to Psychiatry for further evaluation and management. Please schedule a follow-up in 3-4 weeks to assess your mood.  -ANXIETY: Anxiety is a condition marked by excessive worry and stress. This will be managed by the Psychiatry referral.  -TOBACCO USE: You have been smoking half a pack per day since age 27 and have expressed a desire to quit. We encourage you to continue your efforts to quit smoking, and we will address this further in future visits once your mental health is more stable.  -CANNABIS USE: You have been using cannabis since age 27. We encourage you to reduce and eventually stop using cannabis.  -RESTLESS LEG SYNDROME: Restless leg syndrome is a condition that causes an uncontrollable urge to move your legs, usually due to an uncomfortable sensation. We will order a complete metabolic panel, CBC, Hemoglobin A1c, and thyroid levels to rule out any potential contributing factors.  -HIDRADENITIS SUPPURATIVA: Hidradenitis suppurativa is a skin condition that causes painful, pus-filled lesions. We will refer you to Dermatology for further evaluation and management.  -SICKLE CELL TRAIT: Sickle cell trait means you carry one gene for sickle cell disease but usually do not have symptoms. This is noted for completeness.  -GENERAL HEALTH MAINTENANCE: We will order labs including a complete metabolic panel, CBC, Hemoglobin A1c, and thyroid levels. Please schedule a follow-up in 3-4  weeks.  INSTRUCTIONS:  Please schedule a follow-up appointment in 3-4 weeks to assess your mood and review lab results. Additionally, you will be referred to Psychiatry and Dermatology for further evaluation and management.

## 2023-01-07 LAB — CMP14+EGFR
ALT: 25 [IU]/L (ref 0–32)
AST: 21 [IU]/L (ref 0–40)
Albumin: 4.7 g/dL (ref 4.0–5.0)
Alkaline Phosphatase: 56 [IU]/L (ref 44–121)
BUN/Creatinine Ratio: 13 (ref 9–23)
BUN: 10 mg/dL (ref 6–20)
Bilirubin Total: 0.2 mg/dL (ref 0.0–1.2)
CO2: 20 mmol/L (ref 20–29)
Calcium: 9.7 mg/dL (ref 8.7–10.2)
Chloride: 102 mmol/L (ref 96–106)
Creatinine, Ser: 0.77 mg/dL (ref 0.57–1.00)
Globulin, Total: 2.7 g/dL (ref 1.5–4.5)
Glucose: 86 mg/dL (ref 70–99)
Potassium: 4.3 mmol/L (ref 3.5–5.2)
Sodium: 137 mmol/L (ref 134–144)
Total Protein: 7.4 g/dL (ref 6.0–8.5)
eGFR: 107 mL/min/{1.73_m2} (ref >59)

## 2023-01-07 LAB — LIPID PANEL
Chol/HDL Ratio: 3.9 ratio (ref 0.0–4.4)
Cholesterol, Total: 236 mg/dL — ABNORMAL HIGH (ref 100–199)
HDL: 60 mg/dL (ref >39)
LDL Chol Calc (NIH): 157 mg/dL — ABNORMAL HIGH (ref 0–99)
Triglycerides: 106 mg/dL (ref 0–149)
VLDL Cholesterol Cal: 19 mg/dL (ref 5–40)

## 2023-01-07 LAB — HEMOGLOBIN A1C
Est. average glucose Bld gHb Est-mCnc: 123 mg/dL
Hgb A1c MFr Bld: 5.9 % — ABNORMAL HIGH (ref 4.8–5.6)

## 2023-01-07 LAB — CBC
Hematocrit: 39.4 % (ref 34.0–46.6)
Hemoglobin: 13.3 g/dL (ref 11.1–15.9)
MCH: 28.5 pg (ref 26.6–33.0)
MCHC: 33.8 g/dL (ref 31.5–35.7)
MCV: 84 fL (ref 79–97)
Platelets: 329 10*3/uL (ref 150–450)
RBC: 4.67 x10E6/uL (ref 3.77–5.28)
RDW: 13.7 % (ref 11.7–15.4)
WBC: 14.6 10*3/uL — ABNORMAL HIGH (ref 3.4–10.8)

## 2023-01-07 LAB — TSH+T4F+T3FREE
Free T4: 0.97 ng/dL (ref 0.82–1.77)
T3, Free: 2.8 pg/mL (ref 2.0–4.4)
TSH: 3.24 u[IU]/mL (ref 0.450–4.500)

## 2023-01-07 NOTE — Assessment & Plan Note (Signed)
Chronic  Referred to derm

## 2023-01-07 NOTE — Assessment & Plan Note (Addendum)
Major Depressive Disorder Chronic  Severe  History of suicidal ideation and a suicide attempt. Currently experiencing increased stress due to life changes. Previously tried Zoloft and diazepam with mixed results as patient reports not liking the SE of these agents  -Start Wellbutrin 150mg  daily. -Refer to Psychiatry for further evaluation and management. -Schedule follow-up in 3-4 weeks to assess mood.

## 2023-01-07 NOTE — Assessment & Plan Note (Signed)
Hidradenitis Suppurativa Recent onset of painful, pus-filled lesions in the axillary region. -Refer to Dermatology for further evaluation and management.

## 2023-01-07 NOTE — Assessment & Plan Note (Signed)
Chronic issue  No complaints noted today  Reports intermittent hx of myalgias if dehydrated or experiencing increased stressed  Emphasized importance of hydration and rest with stress management

## 2023-01-07 NOTE — Assessment & Plan Note (Signed)
Discussed protective factors including being present for her children and looking forward to getting several things done  Patient reports positive SI, hx of attempt of suicide  Denies plan to end her life today  Suicide crisis resources provided

## 2023-01-07 NOTE — Assessment & Plan Note (Signed)
  Chronic issue since childhood, worsens with stress. -Order complete metabolic panel, CBC, Hemoglobin A1c, and thyroid levels to rule out potential contributing factors. -would like to try trial of requip in the future if labs normal

## 2023-01-09 ENCOUNTER — Encounter: Payer: Self-pay | Admitting: Family Medicine

## 2023-01-11 MED ORDER — FLUCONAZOLE 150 MG PO TABS: 150.0000 mg | ORAL_TABLET | Freq: Once | ORAL | 0 refills | Status: AC

## 2023-01-11 MED ORDER — CLINDAMYCIN HCL 300 MG PO CAPS: 300.0000 mg | ORAL_CAPSULE | Freq: Two times a day (BID) | ORAL | 0 refills | Status: AC

## 2023-02-09 ENCOUNTER — Ambulatory Visit: Payer: Medicaid Other | Admitting: Family Medicine

## 2023-02-09 NOTE — Progress Notes (Unsigned)
      Established patient visit   Patient: Judith Lee   DOB: May 17, 1995   27 y.o. Female  MRN: [id number] Visit Date: 02/09/2023  Today's healthcare provider: Ronnald Ramp, MD   No chief complaint on file.  Subjective       Discussed the use of AI scribe software for clinical note transcription with the patient, who gave verbal consent to proceed.  History of Present Illness           Derm appt scheduled for Jan 2025  Past Medical History:  Diagnosis Date   Anxiety    Panic attack    Sickle cell trait (HCC)     Medications: Outpatient Medications Prior to Visit  Medication Sig   buPROPion (WELLBUTRIN XL) 150 MG 24 hr tablet Take 1 tablet (150 mg total) by mouth daily.   No facility-administered medications prior to visit.    Review of Systems  Last CBC Lab Results  Component Value Date   WBC 14.6 (H) 01/06/2023   HGB 13.3 01/06/2023   HCT 39.4 01/06/2023   MCV 84 01/06/2023   MCH 28.5 01/06/2023   RDW 13.7 01/06/2023   PLT 329 01/06/2023   Last metabolic panel Lab Results  Component Value Date   GLUCOSE 86 01/06/2023   NA 137 01/06/2023   K 4.3 01/06/2023   CL 102 01/06/2023   CO2 20 01/06/2023   BUN 10 01/06/2023   CREATININE 0.77 01/06/2023   EGFR 107 01/06/2023   CALCIUM 9.7 01/06/2023   PROT 7.4 01/06/2023   ALBUMIN 4.7 01/06/2023   LABGLOB 2.7 01/06/2023   BILITOT 0.2 01/06/2023   ALKPHOS 56 01/06/2023   AST 21 01/06/2023   ALT 25 01/06/2023   ANIONGAP 8 11/06/2022   Last lipids Lab Results  Component Value Date   CHOL 236 (H) 01/06/2023   HDL 60 01/06/2023   LDLCALC 157 (H) 01/06/2023   TRIG 106 01/06/2023   CHOLHDL 3.9 01/06/2023   Last hemoglobin A1c Lab Results  Component Value Date   HGBA1C 5.9 (H) 01/06/2023   Last thyroid functions Lab Results  Component Value Date   TSH 3.240 01/06/2023     {See past labs  Heme  Chem  Endocrine  Serology  Results Review (optional):1}   Objective    There  were no vitals taken for this visit. {Insert last BP/Wt (optional):23777}{See vitals history (optional):1}    Physical Exam  ***  No results found for any visits on 02/09/23.  Assessment & Plan     Problem List Items Addressed This Visit   None   Assessment and Plan              No follow-ups on file.         Ronnald Ramp, MD  [facility] [phone] (phone) [phone] (fax)  [facility]

## 2023-03-01 ENCOUNTER — Ambulatory Visit: Payer: Medicaid Other | Admitting: Family Medicine

## 2023-03-15 ENCOUNTER — Ambulatory Visit: Payer: Medicaid Other | Admitting: Family Medicine

## 2023-03-15 NOTE — Progress Notes (Deleted)
      Established patient visit   Patient: Judith Lee   DOB: 1995/06/12   28 y.o. Female  MRN: [id number] Visit Date: 03/15/2023  Today's healthcare provider: Rockie Agent, MD   No chief complaint on file.  Subjective       Discussed the use of AI scribe software for clinical note transcription with the patient, who gave verbal consent to proceed.  History of Present Illness             Past Medical History:  Diagnosis Date   Anxiety    Panic attack    Sickle cell trait (HCC)     Medications: Outpatient Medications Prior to Visit  Medication Sig   buPROPion  (WELLBUTRIN  XL) 150 MG 24 hr tablet Take 1 tablet (150 mg total) by mouth daily.   No facility-administered medications prior to visit.    Review of Systems  {Insert previous labs (optional):23779} {See past labs  Heme  Chem  Endocrine  Serology  Results Review (optional):1}   Objective    There were no vitals taken for this visit. {Insert last BP/Wt (optional):23777}{See vitals history (optional):1}    Physical Exam  ***  No results found for any visits on 03/15/23.  Assessment & Plan     Problem List Items Addressed This Visit   None   Assessment and Plan              No follow-ups on file.         Rockie Agent, MD  [facility] [phone] (phone) [phone] (fax)  [facility]

## 2023-03-31 ENCOUNTER — Encounter: Payer: Self-pay | Admitting: Dermatology

## 2023-03-31 ENCOUNTER — Ambulatory Visit: Payer: Medicaid Other | Admitting: Dermatology

## 2023-03-31 DIAGNOSIS — L509 Urticaria, unspecified: Secondary | ICD-10-CM | POA: Diagnosis not present

## 2023-03-31 DIAGNOSIS — L810 Postinflammatory hyperpigmentation: Secondary | ICD-10-CM

## 2023-03-31 DIAGNOSIS — L739 Follicular disorder, unspecified: Secondary | ICD-10-CM

## 2023-03-31 MED ORDER — CETIRIZINE HCL 5 MG PO TABS: 5.0000 mg | ORAL_TABLET | Freq: Every day | ORAL | 2 refills | Status: AC

## 2023-03-31 NOTE — Progress Notes (Signed)
   New Patient Visit   Subjective  Judith Lee is a 28 y.o. female who presents for the following: breaking out in hives x2 months, pt states mostly occurs at night mostly but can be during the day, typically does not last all day.   Pt states she also has been dealing with abscesses R underarm. Pt has been prescribed clindamycin by PCP and did help. Pt states she was recently put on bupropion 150mg  daily but has not taken them in 3 weeks due to possibly being related to hives. Pt takes benadryl or generic allergy meds when hives appear, at times helps but at times doesn't.   The patient has spots, moles and lesions to be evaluated, some may be new or changing and the patient may have concern these could be cancer.   The following portions of the chart were reviewed this encounter and updated as appropriate: medications, allergies, medical history  Review of Systems:  No other skin or systemic complaints except as noted in HPI or Assessment and Plan.  Objective  Well appearing patient in no apparent distress; mood and affect are within normal limits.   A focused examination was performed of the following areas: Bilateral underarms  Relevant exam findings are noted in the Assessment and Plan.    Assessment & Plan    Urticaria, chronic (2 months) Exam: clear. Patient supplied photos of lesions consistent with urticaria  Treatment Plan: Possible to be 2/2 bupropion but less likely given they have persisted for 3 weeks after stopping Often idiopathic Start Zyrtec 5mg  BID one morning and one evening for x2 weeks, if causing drowsiness can take both tablets at night. Can go up to 2 Zyrtec twice a day if needed.  If antihistamine does not help, will refer to allergist for further treatment.    Folliculitis vs HS Exam: R axilla hyperpigmented patches, postinflammatory  Treatment Plan: Recommend not shaving so close to skin  Recommend Hibiclens wash, antiseptic wash to use in  shower once a day.      URTICARIA   FOLLICULITIS   POSTINFLAMMATORY HYPERPIGMENTATION    Return in about 5 weeks (around 05/05/2023) for w/ Dr. Katrinka Blazing.  Wynonia Lawman, CMA, am acting as scribe for Elie Goody, MD .   Documentation: I have reviewed the above documentation for accuracy and completeness, and I agree with the above.  Elie Goody, MD

## 2023-03-31 NOTE — Patient Instructions (Addendum)
Recommend Hibiclens wash, antiseptic wash to use in shower once a day.    Due to recent changes in healthcare laws, you may see results of your pathology and/or laboratory studies on MyChart before the doctors have had a chance to review them. We understand that in some cases there may be results that are confusing or concerning to you. Please understand that not all results are received at the same time and often the doctors may need to interpret multiple results in order to provide you with the best plan of care or course of treatment. Therefore, we ask that you please give [place] 2 business days to thoroughly review all your results before contacting the office for clarification. Should we see a critical lab result, you will be contacted sooner.   If You Need Anything After Your Visit  If you have any questions or concerns for your doctor, please call our main line at [phone] and press option 4 to reach your doctor's medical assistant. If no one answers, please leave a voicemail as directed and we will return your call as soon as possible. Messages left after 4 pm will be answered the following business day.   You may also send [place] a message via MyChart. We typically respond to MyChart messages within 1-2 business days.  For prescription refills, please ask your pharmacy to contact our office. Our fax number is [phone].  If you have an urgent issue when the clinic is closed that cannot wait until the next business day, you can page your doctor at the number below.    Please note that while we do our best to be available for urgent issues outside of office hours, we are not available 24/7.   If you have an urgent issue and are unable to reach [place], you may choose to seek medical care at your doctor's office, retail clinic, urgent care center, or emergency room.  If you have a medical emergency, please immediately call 911 or go to the emergency department.  Pager Numbers  - Dr.  Gwen Pounds: [phone]  - Dr. Roseanne Reno: [phone]  - Dr. Katrinka Blazing: [phone]   In the event of inclement weather, please call our main line at [phone] for an update on the status of any delays or closures.  Dermatology Medication Tips: Please keep the boxes that topical medications come in in order to help keep track of the instructions about where and how to use these. Pharmacies typically print the medication instructions only on the boxes and not directly on the medication tubes.   If your medication is too expensive, please contact our office at [phone] option 4 or send [place] a message through MyChart.   We are unable to tell what your co-pay for medications will be in advance as this is different depending on your insurance coverage. However, we may be able to find a substitute medication at lower cost or fill out paperwork to get insurance to cover a needed medication.   If a prior authorization is required to get your medication covered by your insurance company, please allow [place] 1-2 business days to complete this process.  Drug prices often vary depending on where the prescription is filled and some pharmacies may offer cheaper prices.  The website www.goodrx.com contains coupons for medications through different pharmacies. The prices here do not account for what the cost may be with help from insurance (it may be cheaper with your insurance), but the website can give you the price if you did  not use any insurance.  - You can print the associated coupon and take it with your prescription to the pharmacy.  - You may also stop by our office during regular business hours and pick up a GoodRx coupon card.  - If you need your prescription sent electronically to a different pharmacy, notify our office through [facility] or by phone at [phone] option 4.     Si Usted Necesita Algo Despus de Su Visita  Tambin puede enviarnos un mensaje a travs de Clinical cytogeneticist. Por lo  general respondemos a los mensajes de MyChart en el transcurso de 1 a 2 das hbiles.  Para renovar recetas, por favor pida a su farmacia que se ponga en contacto con nuestra oficina. Annie Sable de fax es [place] [phone].  Si tiene un asunto urgente cuando la clnica est cerrada y que no puede esperar hasta el siguiente da hbil, puede llamar/localizar a su doctor(a) al nmero que aparece a continuacin.   Por favor, tenga en cuenta que aunque hacemos todo lo posible para estar disponibles para asuntos urgentes fuera del horario de [place], no estamos disponibles las 24 horas del da, los 7 [address]  [address] de la [place].   Si tiene un problema urgente y no puede comunicarse con nosotros, puede optar por buscar atencin mdica  en el consultorio de su doctor(a), en una clnica privada, en un centro de atencin urgente o en una sala de emergencias.  Si tiene Engineer, drilling, por favor llame inmediatamente al 911 o vaya a la sala de emergencias.  Nmeros de bper  - Dr. Gwen Pounds: [phone]  - Dra. Roseanne Reno: [phone]  - Dr. Katrinka Blazing: [phone]   En caso de inclemencias del tiempo, por favor llame a Lacy Duverney principal al [phone] para una actualizacin sobre el [place] de cualquier retraso o cierre.  Consejos para la medicacin en dermatologa: Por favor, guarde las cajas en las que vienen los medicamentos de uso tpico para ayudarle a seguir las instrucciones sobre dnde y cmo usarlos. Las farmacias generalmente imprimen las instrucciones del medicamento slo en las cajas y no directamente en los tubos del [place].   Si su medicamento es muy caro, por favor, pngase en contacto con Rolm Gala llamando al [phone] y presione la opcin 4 o envenos un mensaje a travs de Clinical cytogeneticist.   No podemos decirle cul ser su copago por los medicamentos por adelantado ya que esto es diferente dependiendo de la cobertura de su seguro. Sin embargo, es posible que podamos encontrar  un medicamento sustituto a Audiological scientist un formulario para que el seguro cubra el medicamento que se considera necesario.   Si se requiere una autorizacin previa para que su compaa de seguros [place] su medicamento, por favor permtanos de 1 a 2 das hbiles para completar [address]  Los precios de los medicamentos varan con frecuencia dependiendo del Environmental consultant de dnde se surte la receta y alguna farmacias pueden ofrecer precios ms baratos.  El sitio web www.goodrx.com tiene cupones para medicamentos de Health and safety inspector. Los precios aqu no tienen en cuenta lo que podra costar con la ayuda del seguro (puede ser ms barato con su seguro), pero el sitio web puede darle el precio si no utiliz Tourist information centre manager.  - Puede imprimir el cupn correspondiente y llevarlo con su receta a la farmacia.  - Tambin puede pasar por nuestra oficina durante el horario de atencin regular y Education officer, museum una tarjeta de cupones de GoodRx.  - Si necesita que su receta se  enve electrnicamente a una farmacia diferente, informe a nuestra oficina a travs de MyChart de [place] o por telfono llamando al [phone] y presione la opcin 4.

## 2023-04-01 ENCOUNTER — Other Ambulatory Visit (HOSPITAL_COMMUNITY)
Admission: RE | Admit: 2023-04-01 | Discharge: 2023-04-01 | Disposition: A | Discharge status: Home / Self Care | Payer: Medicaid Other | Source: Ambulatory Visit | Attending: Family Medicine | Admitting: Family Medicine

## 2023-04-01 ENCOUNTER — Encounter: Payer: Self-pay | Admitting: Family Medicine

## 2023-04-01 ENCOUNTER — Ambulatory Visit: Payer: Medicaid Other | Admitting: Family Medicine

## 2023-04-01 VITALS — BP 168/97 | HR 78 | Ht 63.0 in | Wt 231.0 lb

## 2023-04-01 DIAGNOSIS — L509 Urticaria, unspecified: Secondary | ICD-10-CM

## 2023-04-01 DIAGNOSIS — F339 Major depressive disorder, recurrent, unspecified: Secondary | ICD-10-CM

## 2023-04-01 DIAGNOSIS — B9689 Other specified bacterial agents as the cause of diseases classified elsewhere: Secondary | ICD-10-CM

## 2023-04-01 DIAGNOSIS — N898 Other specified noninflammatory disorders of vagina: Secondary | ICD-10-CM | POA: Diagnosis not present

## 2023-04-01 DIAGNOSIS — N760 Acute vaginitis: Secondary | ICD-10-CM

## 2023-04-01 DIAGNOSIS — R030 Elevated blood-pressure reading, without diagnosis of hypertension: Secondary | ICD-10-CM

## 2023-04-01 NOTE — Progress Notes (Signed)
Established patient visit   Patient: Judith Lee   DOB: 04-Mar-1995   28 y.o. Female  MRN: [id number] Visit Date: 04/01/2023  Today's healthcare provider: Ronnald Ramp, MD   Chief Complaint  Patient presents with   Follow-up    Wellbutrin-causes hives especially at night time. Discharge-thick/white, sweet smell and no pain--1 week   Subjective     HPI     Follow-up    Additional comments: Wellbutrin-causes hives especially at night time. Discharge-thick/white, sweet smell and no pain--1 week      Last edited by Shelly Bombard, CMA on 04/01/2023  3:51 PM.       Discussed the use of AI scribe software for clinical note transcription with the patient, who gave verbal consent to proceed.  History of Present Illness   The patient is a 28 year old female with depression who presents for follow-up. She is accompanied by her partner.  She had been taking Wellbutrin, which initially worsened her mood with increased crying and a 'bad attitude' during the first two weeks. Subsequently, she experienced some improvement in energy levels. She discontinued Wellbutrin two weeks ago due to the development of hives, which she has had since childhood but not recently. The hives were primarily on her abdomen, arms, feet, and hands, and were more pronounced at night. She has been using Benadryl and generic allergy medications without significant improvement. She has not been on any mood medications for the past two weeks.  She experiences anxiety that remains unchanged regardless of medication. Her anxiety leads to overthinking and a constant list of tasks in her mind, which she finds difficult to control. She believes managing her anxiety might help with her other emotional symptoms.  She reports a white vaginal discharge with an abnormal smell for the past week, without associated pain or itching. She has used boric acid suppositories without improvement.  She has a history of  gestational diabetes and is currently prediabetic with an HbA1c of 5.9%.         Past Medical History:  Diagnosis Date   Anxiety    Panic attack    Sickle cell trait (HCC)     Medications: Outpatient Medications Prior to Visit  Medication Sig   cetirizine (ZYRTEC) 5 MG tablet Take 1 tablet (5 mg total) by mouth daily. Take 5mg  twice a day, one morning and one evening.   [DISCONTINUED] buPROPion (WELLBUTRIN XL) 150 MG 24 hr tablet Take 1 tablet (150 mg total) by mouth daily. (Patient not taking: Reported on 04/01/2023)   No facility-administered medications prior to visit.    Review of Systems  Last CBC Lab Results  Component Value Date   WBC 14.6 (H) 01/06/2023   HGB 13.3 01/06/2023   HCT 39.4 01/06/2023   MCV 84 01/06/2023   MCH 28.5 01/06/2023   RDW 13.7 01/06/2023   PLT 329 01/06/2023   Last metabolic panel Lab Results  Component Value Date   GLUCOSE 86 01/06/2023   NA 137 01/06/2023   K 4.3 01/06/2023   CL 102 01/06/2023   CO2 20 01/06/2023   BUN 10 01/06/2023   CREATININE 0.77 01/06/2023   EGFR 107 01/06/2023   CALCIUM 9.7 01/06/2023   PROT 7.4 01/06/2023   ALBUMIN 4.7 01/06/2023   LABGLOB 2.7 01/06/2023   BILITOT 0.2 01/06/2023   ALKPHOS 56 01/06/2023   AST 21 01/06/2023   ALT 25 01/06/2023   ANIONGAP 8 11/06/2022   Last lipids Lab Results  Component Value Date   CHOL 236 (H) 01/06/2023   HDL 60 01/06/2023   LDLCALC 157 (H) 01/06/2023   TRIG 106 01/06/2023   CHOLHDL 3.9 01/06/2023   Last hemoglobin A1c Lab Results  Component Value Date   HGBA1C 5.9 (H) 01/06/2023   Last thyroid functions Lab Results  Component Value Date   TSH 3.240 01/06/2023   Last vitamin D No results found for: "25OHVITD2", "25OHVITD3", "VD25OH" Last vitamin B12 and Folate No results found for: "VITAMINB12", "FOLATE"      Objective    BP (!) 168/97 (BP Location: Right Arm, Patient Position: Sitting, Cuff Size: Large)   Pulse 78   Ht 5\' 3"  (1.6 m)   Wt 231  lb (104.8 kg)   SpO2 97%   BMI 40.92 kg/m  BP Readings from Last 3 Encounters:  04/01/23 (!) 168/97  01/06/23 132/79  11/06/22 (!) 145/94   Wt Readings from Last 3 Encounters:  04/01/23 231 lb (104.8 kg)  01/06/23 240 lb (108.9 kg)  11/06/22 280 lb (127 kg)       Physical Exam  General: Alert, no acute distress Cardio: Normal S1 and S2, RRR, no r/m/g Pulm: CTAB, normal work of breathing Psych: calm mood, normal affect, normal speech, no objective findings concerning for auditory or visual hallucinations   No results found for any visits on 04/01/23.  Assessment & Plan     Problem List Items Addressed This Visit       Musculoskeletal and Integument   Urticarial rash   Recurrent hives, possibly exacerbated by Wellbutrin. Symptoms: hives on abdomen, arms, feet, and hands, primarily at night. Managed with cetirizine 5 mg twice daily as prescribed by dermatology. - Continue cetirizine 5 mg twice daily - continue follow up with derm as previously scheduled       Relevant Orders   Ambulatory referral to Psychiatry     Other   Vaginal discharge - Primary   White vaginal discharge with abnormal smell, no pain or itching. Differential diagnosis: yeast infection, bacterial vaginosis. Recent use of boric acid suppositories without significant improvement. Discussed Diflucan for yeast infection and potential impact of stress or hormonal changes. - Prescribe Diflucan 150 mg, one dose today and a second dose in three days if symptoms persist - wet prep vaginal swab self collected will send for further analysis to confirm diagnosis      Relevant Orders   Cervicovaginal ancillary only   Elevated blood pressure reading   Will need to repeat BP check at follow up visit  Recommended smoking cessation  No current medications       Depression, recurrent (HCC)   Chronic depression with poor response to multiple medications, including Valium, Zoloft, and Wellbutrin. Recent  exacerbation with Wellbutrin, including hives, leading to discontinuation. Current symptoms: moodiness, sadness, irritability. 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 Emphasized urgent psychiatric referral. Discussed risks and benefits of Rexulti and Abilify, noting their use for atypical depression not responsive to other treatments. - Discontinue Wellbutrin - Urgent referral to psychiatry - Consider Rexulti or Abilify pending psychiatric evaluation, pt prefers to hold off on additional medication until she sees psychiatry  Chronic anxiety exacerbating depressive symptoms. Symptoms: overthinking, constant worry, difficulty managing stress. Discussed integrated treatment for both anxiety and depression, noting medications like Zoloft can address both conditions. - Address anxiety management in psychiatric referral - Consider integrated treatment for both anxiety and depression        Prediabetes HbA1c of 5.9%, indicating prediabetes. Discussed lifestyle  modifications to prevent progression to diabetes. Emphasized reversibility with appropriate diet and exercise. Recommended incorporating more vegetables and reducing carbohydrate intake, such as mixing rice with cauliflower rice. - Encourage 20-30 minutes of exercise most days of the week - Recommend dietary modifications, including reducing carbohydrate intake and incorporating more vegetables - Recheck HbA1c in May  General Health Maintenance Discussed the importance of regular follow-up and monitoring of chronic conditions. Emphasized ongoing support and management of mental health and prediabetes. - Schedule follow-up appointment in four weeks - Prioritize psychiatric appointment if available sooner         No follow-ups on file.         Ronnald Ramp, MD  [facility] [phone] (phone) [phone] (fax)  [facility]

## 2023-04-02 DIAGNOSIS — N898 Other specified noninflammatory disorders of vagina: Secondary | ICD-10-CM | POA: Insufficient documentation

## 2023-04-02 DIAGNOSIS — L509 Urticaria, unspecified: Secondary | ICD-10-CM | POA: Insufficient documentation

## 2023-04-02 DIAGNOSIS — R030 Elevated blood-pressure reading, without diagnosis of hypertension: Secondary | ICD-10-CM | POA: Insufficient documentation

## 2023-04-02 NOTE — Assessment & Plan Note (Signed)
Will need to repeat BP check at follow up visit  Recommended smoking cessation  No current medications

## 2023-04-02 NOTE — Assessment & Plan Note (Signed)
Recurrent hives, possibly exacerbated by Wellbutrin. Symptoms: hives on abdomen, arms, feet, and hands, primarily at night. Managed with cetirizine 5 mg twice daily as prescribed by dermatology. - Continue cetirizine 5 mg twice daily - continue follow up with derm as previously scheduled

## 2023-04-02 NOTE — Assessment & Plan Note (Addendum)
Chronic depression with poor response to multiple medications, including Valium, Zoloft, and Wellbutrin. Recent exacerbation with Wellbutrin, including hives, leading to discontinuation. Current symptoms: moodiness, sadness, irritability. 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 Emphasized urgent psychiatric referral. Discussed risks and benefits of Rexulti and Abilify, noting their use for atypical depression not responsive to other treatments. - Discontinue Wellbutrin - Urgent referral to psychiatry - Consider Rexulti or Abilify pending psychiatric evaluation, pt prefers to hold off on additional medication until she sees psychiatry  Chronic anxiety exacerbating depressive symptoms. Symptoms: overthinking, constant worry, difficulty managing stress. Discussed integrated treatment for both anxiety and depression, noting medications like Zoloft can address both conditions. - Address anxiety management in psychiatric referral - Consider integrated treatment for both anxiety and depression

## 2023-04-02 NOTE — Assessment & Plan Note (Signed)
White vaginal discharge with abnormal smell, no pain or itching. Differential diagnosis: yeast infection, bacterial vaginosis. Recent use of boric acid suppositories without significant improvement. Discussed Diflucan for yeast infection and potential impact of stress or hormonal changes. - Prescribe Diflucan 150 mg, one dose today and a second dose in three days if symptoms persist - wet prep vaginal swab self collected will send for further analysis to confirm diagnosis

## 2023-04-05 ENCOUNTER — Encounter: Payer: Self-pay | Admitting: Family Medicine

## 2023-04-05 LAB — CERVICOVAGINAL ANCILLARY ONLY
Bacterial Vaginitis (gardnerella): POSITIVE — AB
Candida Glabrata: NEGATIVE
Candida Vaginitis: NEGATIVE
Chlamydia: NEGATIVE
Comment: NEGATIVE
Comment: NEGATIVE
Comment: NEGATIVE
Comment: NEGATIVE
Comment: NEGATIVE
Comment: NORMAL
Neisseria Gonorrhea: NEGATIVE
Trichomonas: NEGATIVE

## 2023-04-05 MED ORDER — METRONIDAZOLE 500 MG PO TABS: 500.0000 mg | ORAL_TABLET | Freq: Three times a day (TID) | ORAL | 0 refills | Status: AC

## 2023-04-05 MED ORDER — FLUCONAZOLE 150 MG PO TABS: 150.0000 mg | ORAL_TABLET | Freq: Once | ORAL | 0 refills | Status: AC

## 2023-04-05 NOTE — Addendum Note (Signed)
Addended by: Bing Neighbors on: 04/05/2023 01:13 PM   Modules accepted: Orders

## 2023-04-20 ENCOUNTER — Encounter: Payer: Self-pay | Admitting: Family Medicine

## 2023-04-20 DIAGNOSIS — N760 Acute vaginitis: Secondary | ICD-10-CM

## 2023-04-21 MED ORDER — METRONIDAZOLE 0.75 % VA GEL: VAGINAL | 6 refills | Status: DC

## 2023-04-21 MED ORDER — FLUCONAZOLE 150 MG PO TABS: ORAL_TABLET | ORAL | 0 refills | Status: DC

## 2023-04-21 MED ORDER — METRONIDAZOLE 500 MG PO TABS: 500.0000 mg | ORAL_TABLET | Freq: Two times a day (BID) | ORAL | 0 refills | Status: DC

## 2023-04-27 MED ORDER — METRONIDAZOLE 0.75 % VA GEL: VAGINAL | 6 refills | Status: AC

## 2023-04-27 MED ORDER — METRONIDAZOLE 500 MG PO TABS: 500.0000 mg | ORAL_TABLET | Freq: Two times a day (BID) | ORAL | 0 refills | Status: AC

## 2023-04-27 MED ORDER — FLUCONAZOLE 150 MG PO TABS
ORAL_TABLET | ORAL | 0 refills | Status: AC
Start: 2023-04-27 — End: ?

## 2023-04-27 NOTE — Addendum Note (Signed)
 Addended by: Marjie Skiff on: 04/27/2023 04:08 PM   Modules accepted: Orders

## 2023-05-04 ENCOUNTER — Ambulatory Visit: Payer: Self-pay | Admitting: Family Medicine

## 2023-05-05 ENCOUNTER — Ambulatory Visit: Payer: Medicaid Other | Admitting: Dermatology

## 2023-11-22 ENCOUNTER — Ambulatory Visit: Payer: Self-pay | Admitting: Physician Assistant

## 2024-03-07 ENCOUNTER — Encounter (HOSPITAL_BASED_OUTPATIENT_CLINIC_OR_DEPARTMENT_OTHER): Payer: Self-pay

## 2024-03-07 ENCOUNTER — Other Ambulatory Visit: Payer: Self-pay

## 2024-03-07 ENCOUNTER — Emergency Department (HOSPITAL_BASED_OUTPATIENT_CLINIC_OR_DEPARTMENT_OTHER)
Admission: EM | Admit: 2024-03-07 | Discharge: 2024-03-07 | Disposition: A | Discharge status: Home / Self Care | Attending: Emergency Medicine | Admitting: Emergency Medicine

## 2024-03-07 DIAGNOSIS — F1210 Cannabis abuse, uncomplicated: Secondary | ICD-10-CM | POA: Insufficient documentation

## 2024-03-07 DIAGNOSIS — F1110 Opioid abuse, uncomplicated: Secondary | ICD-10-CM | POA: Insufficient documentation

## 2024-03-07 DIAGNOSIS — F1410 Cocaine abuse, uncomplicated: Secondary | ICD-10-CM | POA: Insufficient documentation

## 2024-03-07 DIAGNOSIS — M62838 Other muscle spasm: Secondary | ICD-10-CM | POA: Insufficient documentation

## 2024-03-07 DIAGNOSIS — F1990 Other psychoactive substance use, unspecified, uncomplicated: Secondary | ICD-10-CM

## 2024-03-07 LAB — URINE DRUG SCREEN
Amphetamines: NEGATIVE
Barbiturates: NEGATIVE
Benzodiazepines: NEGATIVE
Cocaine: POSITIVE — AB
Fentanyl: POSITIVE — AB
Methadone Scn, Ur: NEGATIVE
Opiates: NEGATIVE
Tetrahydrocannabinol: POSITIVE — AB

## 2024-03-07 LAB — COMPREHENSIVE METABOLIC PANEL WITH GFR
ALT: 17 U/L (ref 0–44)
AST: 23 U/L (ref 15–41)
Albumin: 4.4 g/dL (ref 3.5–5.0)
Alkaline Phosphatase: 47 U/L (ref 38–126)
Anion gap: 14 (ref 5–15)
BUN: 7 mg/dL (ref 6–20)
CO2: 25 mmol/L (ref 22–32)
Calcium: 9.7 mg/dL (ref 8.9–10.3)
Chloride: 99 mmol/L (ref 98–111)
Creatinine, Ser: 0.69 mg/dL (ref 0.44–1.00)
GFR, Estimated: >60 mL/min
Glucose, Bld: 121 mg/dL — ABNORMAL HIGH (ref 70–99)
Potassium: 3.2 mmol/L — ABNORMAL LOW (ref 3.5–5.1)
Sodium: 137 mmol/L (ref 135–145)
Total Bilirubin: 0.7 mg/dL (ref 0.0–1.2)
Total Protein: 7.2 g/dL (ref 6.5–8.1)

## 2024-03-07 LAB — CBC WITH DIFFERENTIAL/PLATELET
Abs Immature Granulocytes: 0.02 K/uL (ref 0.00–0.07)
Basophils Absolute: 0 K/uL (ref 0.0–0.1)
Basophils Relative: 0 %
Eosinophils Absolute: 0.3 K/uL (ref 0.0–0.5)
Eosinophils Relative: 3 %
HCT: 37 % (ref 36.0–46.0)
Hemoglobin: 12.6 g/dL (ref 12.0–15.0)
Immature Granulocytes: 0 %
Lymphocytes Relative: 25 %
Lymphs Abs: 2.5 K/uL (ref 0.7–4.0)
MCH: 27.3 pg (ref 26.0–34.0)
MCHC: 34.1 g/dL (ref 30.0–36.0)
MCV: 80.1 fL (ref 80.0–100.0)
Monocytes Absolute: 0.7 K/uL (ref 0.1–1.0)
Monocytes Relative: 7 %
Neutro Abs: 6.4 K/uL (ref 1.7–7.7)
Neutrophils Relative %: 65 %
Platelets: 290 K/uL (ref 150–400)
RBC: 4.62 MIL/uL (ref 3.87–5.11)
RDW: 14 % (ref 11.5–15.5)
WBC: 9.8 K/uL (ref 4.0–10.5)
nRBC: 0 % (ref 0.0–0.2)

## 2024-03-07 LAB — URINALYSIS, ROUTINE W REFLEX MICROSCOPIC
Bilirubin Urine: NEGATIVE
Glucose, UA: NEGATIVE mg/dL
Hgb urine dipstick: NEGATIVE
Ketones, ur: 15 mg/dL — AB
Leukocytes,Ua: NEGATIVE
Nitrite: NEGATIVE
Protein, ur: NEGATIVE mg/dL
Specific Gravity, Urine: 1.01 (ref 1.005–1.030)
pH: 8 (ref 5.0–8.0)

## 2024-03-07 LAB — PREGNANCY, URINE: Preg Test, Ur: NEGATIVE

## 2024-03-07 MED ORDER — DROPERIDOL 2.5 MG/ML IJ SOLN
1.2500 mg | Freq: Once | INTRAMUSCULAR | Status: AC
  Administered 2024-03-07: 1.25 mg via INTRAVENOUS
  Filled 2024-03-07: qty 2

## 2024-03-07 MED ORDER — IBUPROFEN 800 MG PO TABS
800.0000 mg | ORAL_TABLET | Freq: Once | ORAL | Status: AC
  Administered 2024-03-07: 800 mg via ORAL
  Filled 2024-03-07: qty 1

## 2024-03-07 MED ORDER — SODIUM CHLORIDE 0.9 % IV BOLUS
1000.0000 mL | Freq: Once | INTRAVENOUS | Status: AC
  Administered 2024-03-07: 1000 mL via INTRAVENOUS

## 2024-03-07 MED ORDER — POTASSIUM CHLORIDE CRYS ER 20 MEQ PO TBCR
40.0000 meq | EXTENDED_RELEASE_TABLET | Freq: Once | ORAL | Status: AC
  Administered 2024-03-07: 40 meq via ORAL
  Filled 2024-03-07: qty 2

## 2024-03-07 NOTE — ED Triage Notes (Signed)
 Pt presents via POV c/o muscle cramps. Report mostly in left leg but sometimes in right leg, and arms.

## 2024-03-07 NOTE — ED Notes (Signed)
 Pt repeatedly removing pulse ox and BP monitor.

## 2024-03-07 NOTE — ED Provider Notes (Signed)
 "  [place] EMERGENCY DEPARTMENT AT [facility]  Provider Note  CSN: [id number] Arrival date & time: 03/07/24 0316  History Chief Complaint  Patient presents with   Spasms    Judith Lee is a 29 y.o. female here for muscle cramps/spasms, most in legs but also in arms. She recent moved from [place] to [place] and reports she had been vomiting for several days prior to symptom onset yesterday, denies fever, abdominal pain or diarrhea. She has had similar symptoms in the past without explanation.    Home Medications Prior to Admission medications  Medication Sig Start Date End Date Taking? Authorizing Provider  cetirizine  (ZYRTEC ) 5 MG tablet Take 1 tablet (5 mg total) by mouth daily. Take 5mg  twice a day, one morning and one evening. 03/31/23   Claudene Lehmann, MD  fluconazole  (DIFLUCAN ) 150 MG tablet Take one 150mg  tablet by mouth and repeat after 72 hours if needed 04/27/23   Simmons-Robinson, Rockie, MD  metroNIDAZOLE  (METROGEL ) 0.75 % vaginal gel Apply 5g intravaginally two times per week 04/27/23   Simmons-Robinson, Rockie, MD     Allergies    Patient has no known allergies.   Review of Systems   Review of Systems Please see HPI for pertinent positives and negatives  Physical Exam BP 134/86 (BP Location: Right Arm)   Pulse 98   Temp 97.6 F (36.4 C)   Resp 19   SpO2 100%   Physical Exam Vitals and nursing note reviewed.  Constitutional:      Appearance: Normal appearance.  HENT:     Head: Normocephalic and atraumatic.     Nose: Nose normal.     Mouth/Throat:     Mouth: Mucous membranes are moist.  Eyes:     Extraocular Movements: Extraocular movements intact.     Conjunctiva/sclera: Conjunctivae normal.  Cardiovascular:     Rate and Rhythm: Normal rate.     Pulses: Normal pulses.  Pulmonary:     Effort: Pulmonary effort is normal.     Breath sounds: Normal breath sounds.  Abdominal:     General: Abdomen is flat.     Palpations:  Abdomen is soft.     Tenderness: There is no abdominal tenderness.  Musculoskeletal:        General: No swelling. Normal range of motion.     Cervical back: Neck supple.  Skin:    General: Skin is warm and dry.  Neurological:     General: No focal deficit present.     Mental Status: She is alert.  Psychiatric:        Mood and Affect: Mood normal.     ED Results / Procedures / Treatments   EKG EKG Interpretation Date/Time:  Tuesday March 07 2024 06:04:52 EST Ventricular Rate:  88 PR Interval:  194 QRS Duration:  107 QT Interval:  375 QTC Calculation: 454 R Axis:   115  Text Interpretation: Sinus rhythm Right axis deviation No significant change since last tracing Confirmed by Roselyn Dunnings [phone]) on 03/07/2024 6:05:21 AM  Procedures Procedures  Medications Ordered in the ED Medications  ibuprofen  (ADVIL ) tablet 800 mg (800 mg Oral Given 03/07/24 0328)  sodium chloride  0.9 % bolus 1,000 mL (0 mLs Intravenous Stopped 03/07/24 0620)  potassium chloride  SA (KLOR-CON  M) CR tablet 40 mEq (40 mEq Oral Given 03/07/24 0611)  droperidol  (INAPSINE ) 2.5 MG/ML injection 1.25 mg (1.25 mg Intravenous Given 03/07/24 0611)    Initial Impression and Plan  Patient here with nonspecific muscle spasms. Given motrin   in triage without much improvement. Exam and vitals are reassuring. Given recent vomiting, will check labs for elyte abnormalities.   ED Course   Clinical Course as of 03/07/24 0633  Tue Mar 07, 2024  0536 CBC is normal. [CS]  0551 UA is clear, small ketones, HCG is neg.  [CS]  0558 CMP with borderline low K, will replete orally.  [CS]  0609 UDS positive for cocaine, THC and fentanyl . She admits to daily fentanyl  use (snorting) including a few hours prior to coming in. She does not feel like she is in withdrawal. Will give a dose of droperidol  and anticipate discharge home with outpatient substance abuse resources.  [CS]    Clinical Course User Index [CS] Roselyn Carlin NOVAK, MD      MDM Rules/Calculators/A&P Medical Decision Making Problems Addressed: Muscle spasm: acute illness or injury Polysubstance use disorder: chronic illness or injury  Amount and/or Complexity of Data Reviewed Labs: ordered. Decision-making details documented in ED Course.  Risk Prescription drug management.     Final Clinical Impression(s) / ED Diagnoses Final diagnoses:  Polysubstance use disorder  Muscle spasm    Rx / DC Orders ED Discharge Orders     None        Roselyn Carlin NOVAK, MD 03/07/24 [phone]  "
